# Patient Record
Sex: Female | Born: 1970 | Race: Asian | Hispanic: No | Marital: Married | Smoking: Never smoker
Health system: Southern US, Community
[De-identification: ages and names within clinical notes are randomized; demographics above are authoritative.]

## PROBLEM LIST (undated history)

## (undated) DIAGNOSIS — Z0000 Encounter for general adult medical examination without abnormal findings: Secondary | ICD-10-CM

## (undated) DIAGNOSIS — Z124 Encounter for screening for malignant neoplasm of cervix: Secondary | ICD-10-CM

## (undated) DIAGNOSIS — R634 Abnormal weight loss: Secondary | ICD-10-CM

## (undated) DIAGNOSIS — M545 Low back pain: Secondary | ICD-10-CM

## (undated) DIAGNOSIS — M1990 Unspecified osteoarthritis, unspecified site: Secondary | ICD-10-CM

## (undated) DIAGNOSIS — M542 Cervicalgia: Secondary | ICD-10-CM

## (undated) DIAGNOSIS — T7840XA Allergy, unspecified, initial encounter: Secondary | ICD-10-CM

## (undated) DIAGNOSIS — R350 Frequency of micturition: Secondary | ICD-10-CM

## (undated) DIAGNOSIS — R42 Dizziness and giddiness: Secondary | ICD-10-CM

## (undated) DIAGNOSIS — L309 Dermatitis, unspecified: Secondary | ICD-10-CM

## (undated) DIAGNOSIS — J309 Allergic rhinitis, unspecified: Secondary | ICD-10-CM

## (undated) HISTORY — DX: Unspecified osteoarthritis, unspecified site: M19.90

## (undated) HISTORY — DX: Allergy, unspecified, initial encounter: T78.40XA

## (undated) HISTORY — DX: Allergic rhinitis, unspecified: J30.9

## (undated) HISTORY — DX: Encounter for screening for malignant neoplasm of cervix: Z12.4

## (undated) HISTORY — DX: Encounter for general adult medical examination without abnormal findings: Z00.00

## (undated) HISTORY — PX: BREAST REDUCTION SURGERY: SHX8

## (undated) HISTORY — DX: Abnormal weight loss: R63.4

## (undated) HISTORY — DX: Low back pain: M54.5

## (undated) HISTORY — DX: Frequency of micturition: R35.0

## (undated) HISTORY — DX: Cervicalgia: M54.2

## (undated) HISTORY — PX: REDUCTION MAMMAPLASTY: SUR839

## (undated) HISTORY — DX: Dermatitis, unspecified: L30.9

## (undated) HISTORY — DX: Dizziness and giddiness: R42

---

## 2014-08-10 ENCOUNTER — Other Ambulatory Visit: Payer: Self-pay | Admitting: Family Medicine

## 2014-08-10 ENCOUNTER — Ambulatory Visit (INDEPENDENT_AMBULATORY_CARE_PROVIDER_SITE_OTHER): Payer: 59 | Admitting: Family Medicine

## 2014-08-10 ENCOUNTER — Encounter: Payer: Self-pay | Admitting: Family Medicine

## 2014-08-10 VITALS — BP 97/72 | HR 67 | Temp 99.5°F | Ht <= 58 in | Wt 109.5 lb

## 2014-08-10 DIAGNOSIS — L309 Dermatitis, unspecified: Secondary | ICD-10-CM | POA: Diagnosis not present

## 2014-08-10 DIAGNOSIS — R350 Frequency of micturition: Secondary | ICD-10-CM

## 2014-08-10 DIAGNOSIS — Z0000 Encounter for general adult medical examination without abnormal findings: Secondary | ICD-10-CM | POA: Diagnosis not present

## 2014-08-10 DIAGNOSIS — R5383 Other fatigue: Secondary | ICD-10-CM

## 2014-08-10 DIAGNOSIS — R319 Hematuria, unspecified: Secondary | ICD-10-CM

## 2014-08-10 LAB — LIPID PANEL
Cholesterol: 139 mg/dL (ref 0–200)
HDL: 49.9 mg/dL (ref >39.00)
LDL Cholesterol: 79 mg/dL (ref 0–99)
NonHDL: 89.1
Total CHOL/HDL Ratio: 3
Triglycerides: 50 mg/dL (ref 0.0–149.0)
VLDL: 10 mg/dL (ref 0.0–40.0)

## 2014-08-10 LAB — COMPREHENSIVE METABOLIC PANEL
ALT: 15 U/L (ref 0–35)
AST: 19 U/L (ref 0–37)
Albumin: 4.3 g/dL (ref 3.5–5.2)
Alkaline Phosphatase: 47 U/L (ref 39–117)
BUN: 18 mg/dL (ref 6–23)
CHLORIDE: 108 meq/L (ref 96–112)
CO2: 25 mEq/L (ref 19–32)
Calcium: 9.3 mg/dL (ref 8.4–10.5)
Creatinine, Ser: 0.63 mg/dL (ref 0.40–1.20)
GFR: 108.18 mL/min (ref >60.00)
GLUCOSE: 82 mg/dL (ref 70–99)
Potassium: 4 mEq/L (ref 3.5–5.1)
Sodium: 140 mEq/L (ref 135–145)
Total Bilirubin: 0.4 mg/dL (ref 0.2–1.2)
Total Protein: 7.1 g/dL (ref 6.0–8.3)

## 2014-08-10 LAB — CBC
HEMATOCRIT: 43.3 % (ref 36.0–46.0)
Hemoglobin: 14.4 g/dL (ref 12.0–15.0)
MCHC: 33.2 g/dL (ref 30.0–36.0)
MCV: 92.6 fl (ref 78.0–100.0)
Platelets: 335 10*3/uL (ref 150.0–400.0)
RBC: 4.68 Mil/uL (ref 3.87–5.11)
RDW: 13 % (ref 11.5–15.5)
WBC: 8.3 10*3/uL (ref 4.0–10.5)

## 2014-08-10 LAB — URINALYSIS
Bilirubin Urine: NEGATIVE
Ketones, ur: NEGATIVE
LEUKOCYTES UA: NEGATIVE
NITRITE: NEGATIVE
PH: 6 (ref 5.0–8.0)
SPECIFIC GRAVITY, URINE: 1.015 (ref 1.000–1.030)
TOTAL PROTEIN, URINE-UPE24: NEGATIVE
Urine Glucose: NEGATIVE
Urobilinogen, UA: 0.2 (ref 0.0–1.0)

## 2014-08-10 LAB — TSH: TSH: 1.55 u[IU]/mL (ref 0.35–4.50)

## 2014-08-10 NOTE — Progress Notes (Signed)
Holly Lawrence  [id number] [birth year] 08/10/2014      Progress Note-Follow Up  Subjective  Chief Complaint  Chief Complaint  Patient presents with  . Establish Care    HPI  Patient is a 44 y.o. female in today for routine medical care. Patient is here today P[place] establish care. Is in generally good health. No recent illness. Does complain of itchy skin. She notes it mostly in her face and there is not usually an associated rash. She does work Chief Strategy [employer] and believe she is having an allergic response. Otherwise the patient is doing well. No recent illness.  Past Medical History  Diagnosis Date  . Arthritis   . Allergy     Past Surgical History  Procedure Laterality Date  . Breast reduction surgery      History reviewed. No pertinent family history.  History   Social History  . Marital Status: Married    Spouse Name: N/A  . Number of Children: N/A  . Years of Education: N/A   Occupational History  . Nail Technician    Social History Main Topics  . Smoking status: Never Smoker   . Smokeless tobacco: Not on file  . Alcohol Use: 0.0 oz/week    0 Standard drinks or equivalent per week  . Drug Use: No  . Sexual Activity: Not on file   Other Topics Concern  . Not on file   Social History Narrative  . No narrative on file    No current outpatient prescriptions on file prior [facility] visit.   No current facility-administered medications on file prior [facility] visit.    No Known Allergies  Review of Systems  Review of Systems  Constitutional: Positive for malaise/fatigue. Negative for fever and chills.  HENT: Negative for congestion, hearing loss and nosebleeds.   Eyes: Negative for discharge.  Respiratory: Positive for cough. Negative for sputum production, shortness of breath and wheezing.   Cardiovascular: Negative for chest pain, palpitations and leg swelling.  Gastrointestinal: Negative for heartburn, nausea, vomiting, abdominal pain, diarrhea, constipation and blood  in stool.  Genitourinary: Positive for frequency. Negative for dysuria, urgency and hematuria.  Musculoskeletal: Negative for myalgias, back pain and falls.  Skin: Negative for rash.  Neurological: Negative for dizziness, tremors, sensory change, focal weakness, loss of consciousness, weakness and headaches.  Endo/Heme/Allergies: Negative for polydipsia. Does not bruise/bleed easily.  Psychiatric/Behavioral: Negative for depression and suicidal ideas. The patient is not nervous/anxious and does not have insomnia.     Objective  BP 97/72 mmHg  Pulse 67  Temp(Src) 99.5 F (37.5 C) (Oral)  Ht  (1.448 m)  Wt 109 lb 8 oz (49.669 kg)  BMI 23.69 kg/m2  SpO2 100%  Physical Exam  Physical Exam  Constitutional: She is oriented [place] person, place, and time and well-developed, well-nourished, and in no distress. No distress.  HENT:  Head: Normocephalic and atraumatic.  Right Ear: External ear normal.  Left Ear: External ear normal.  Nose: Nose normal.  Mouth/Throat: Oropharynx is clear and moist. No oropharyngeal exudate.  Eyes: Conjunctivae are normal. Pupils are equal, round, and reactive Bassin light. Right eye exhibits no discharge. Left eye exhibits no discharge. No scleral icterus.  Neck: Normal range of motion. Neck supple. No thyromegaly present.  Cardiovascular: Normal rate, regular rhythm, normal heart sounds and intact distal pulses.   No murmur heard. Pulmonary/Chest: Effort normal and breath sounds normal. No respiratory distress. She has no wheezes. She has no rales.  Abdominal: Soft. Bowel sounds  are normal. She exhibits no distension and no mass. There is no tenderness.  Musculoskeletal: Normal range of motion. She exhibits no edema or tenderness.  Lymphadenopathy:    She has no cervical adenopathy.  Neurological: She is alert and oriented Ground person, place, and time. She has normal reflexes. No cranial nerve deficit. Coordination normal.  Skin: Skin is warm and dry. No rash  noted. She is not diaphoretic.  Psychiatric: Mood, memory and affect normal.    Assessment & Plan  Preventative health care Patient encouraged Klos maintain heart healthy diet, regular exercise, adequate sleep. Consider daily probiotics. Take medications as prescribed  Dermatitis Most notable on face and mostly itchy, worse on days she works at the [employer]. encouraged daily Cetirizine and report persistent symptoms

## 2014-08-10 NOTE — Patient Instructions (Signed)
Preventive Care for Adults A healthy lifestyle and preventive care can promote health and wellness. Preventive health guidelines for women include the following key practices.  A routine yearly physical is a good way Aron check with your health care provider about your health and preventive screening. It is a chance Shular share any concerns and updates on your health and Griffie receive a thorough exam.  Visit your dentist for a routine exam and preventive care every 6 months. Brush your teeth twice a day and floss once a day. Good oral hygiene prevents tooth decay and gum disease.  The frequency of eye exams is based on your age, health, family medical history, use of contact lenses, and other factors. Follow your health care provider's recommendations for frequency of eye exams.  Eat a healthy diet. Foods like vegetables, fruits, whole grains, low-fat dairy products, and lean protein foods contain the nutrients you need without too many calories. Decrease your intake of foods high in solid fats, added sugars, and salt. Eat the right amount of calories for you.Get information about a proper diet from your health care provider, if necessary.  Regular physical exercise is one of the most important things you can do for your health. Most adults should get at least 150 minutes of moderate-intensity exercise (any activity that increases your heart rate and causes you Jarrard sweat) each week. In addition, most adults need muscle-strengthening exercises on 2 or more days a week.  Maintain a healthy weight. The body mass index (BMI) is a screening tool Pethtel identify possible weight problems. It provides an estimate of body fat based on height and weight. Your health care provider can find your BMI and can help you achieve or maintain a healthy weight.For adults 20 years and older:  A BMI below 18.5 is considered underweight.  A BMI of 18.5 Quin 24.9 is normal.  A BMI of 25 Waitman 29.9 is considered overweight.  A BMI of  30 and above is considered obese.  Maintain normal blood lipids and cholesterol levels by exercising and minimizing your intake of saturated fat. Eat a balanced diet with plenty of fruit and vegetables. Blood tests for lipids and cholesterol should begin at age 76 and be repeated every 5 years. If your lipid or cholesterol levels are high, you are over 50, or you are at high risk for heart disease, you may need your cholesterol levels checked more frequently.Ongoing high lipid and cholesterol levels should be treated with medicines if diet and exercise are not working.  If you smoke, find out from your health care provider how Latino quit. If you do not use tobacco, do not start.  Lung cancer screening is recommended for adults aged 22-80 years who are at high risk for developing lung cancer because of a history of smoking. A yearly low-dose CT scan of the lungs is recommended for people who have at least a 30-pack-year history of smoking and are a current smoker or have quit within the past 15 years. A pack year of smoking is smoking an average of 1 pack of cigarettes a day for 1 year (for example: 1 pack a day for 30 years or 2 packs a day for 15 years). Yearly screening should continue until the smoker has stopped smoking for at least 15 years. Yearly screening should be stopped for people who develop a health problem that would prevent them from having lung cancer treatment.  If you are pregnant, do not drink alcohol. If you are breastfeeding,  be very cautious about drinking alcohol. If you are not pregnant and choose Cenci drink alcohol, do not have more than 1 drink per day. One drink is considered Erhart be 12 ounces (355 mL) of beer, 5 ounces (148 mL) of wine, or 1.5 ounces (44 mL) of liquor.  Avoid use of street drugs. Do not share needles with anyone. Ask for help if you need support or instructions about stopping the use of drugs.  High blood pressure causes heart disease and increases the risk of  stroke. Your blood pressure should be checked at least every 1 Lahman 2 years. Ongoing high blood pressure should be treated with medicines if weight loss and exercise do not work.  If you are 75-52 years old, ask your health care provider if you should take aspirin Wessler prevent strokes.  Diabetes screening involves taking a blood sample Birkey check your fasting blood sugar level. This should be done once every 3 years, after age 15, if you are within normal weight and without risk factors for diabetes. Testing should be considered at a younger age or be carried out more frequently if you are overweight and have at least 1 risk factor for diabetes.  Breast cancer screening is essential preventive care for women. You should practice "breast self-awareness." This means understanding the normal appearance and feel of your breasts and may include breast self-examination. Any changes detected, no matter how small, should be reported Ratterman a health care provider. Women in their 58s and 30s should have a clinical breast exam (CBE) by a health care provider as part of a regular health exam every 1 Bibby 3 years. After age 16, women should have a CBE every year. Starting at age 53, women should consider having a mammogram (breast X-ray test) every year. Women who have a family history of breast cancer should talk Baty their health care provider about genetic screening. Women at a high risk of breast cancer should talk Detloff their health care providers about having an MRI and a mammogram every year.  Breast cancer gene (BRCA)-related cancer risk assessment is recommended for women who have family members with BRCA-related cancers. BRCA-related cancers include breast, ovarian, tubal, and peritoneal cancers. Having family members with these cancers may be associated with an increased risk for harmful changes (mutations) in the breast cancer genes BRCA1 and BRCA2. Results of the assessment will determine the need for genetic counseling and  BRCA1 and BRCA2 testing.  Routine pelvic exams Copher screen for cancer are no longer recommended for nonpregnant women who are considered low risk for cancer of the pelvic organs (ovaries, uterus, and vagina) and who do not have symptoms. Ask your health care provider if a screening pelvic exam is right for you.  If you have had past treatment for cervical cancer or a condition that could lead Lenhardt cancer, you need Pap tests and screening for cancer for at least 20 years after your treatment. If Pap tests have been discontinued, your risk factors (such as having a new sexual partner) need Mandarino be reassessed Couey determine if screening should be resumed. Some women have medical problems that increase the chance of getting cervical cancer. In these cases, your health care provider may recommend more frequent screening and Pap tests.  The HPV test is an additional test that may be used for cervical cancer screening. The HPV test looks for the virus that can cause the cell changes on the cervix. The cells collected during the Pap test can be  tested for HPV. The HPV test could be used Pinegar screen women aged 30 years and older, and should be used in women of any age who have unclear Pap test results. After the age of 30, women should have HPV testing at the same frequency as a Pap test.  Colorectal cancer can be detected and often prevented. Most routine colorectal cancer screening begins at the age of 50 years and continues through age 75 years. However, your health care provider may recommend screening at an earlier age if you have risk factors for colon cancer. On a yearly basis, your health care provider may provide home test kits Marseille check for hidden blood in the stool. Use of a small camera at the end of a tube, Straughter directly examine the colon (sigmoidoscopy or colonoscopy), can detect the earliest forms of colorectal cancer. Talk Mascaro your health care provider about this at age 50, when routine screening begins. Direct  exam of the colon should be repeated every 5-10 years through age 75 years, unless early forms of pre-cancerous polyps or small growths are found.  People who are at an increased risk for hepatitis B should be screened for this virus. You are considered at high risk for hepatitis B if:  You were born in a country where hepatitis B occurs often. Talk with your health care provider about which countries are considered high risk.  Your parents were born in a high-risk country and you have not received a shot Hur protect against hepatitis B (hepatitis B vaccine).  You have HIV or AIDS.  You use needles Scicchitano inject street drugs.  You live with, or have sex with, someone who has hepatitis B.  You get hemodialysis treatment.  You take certain medicines for conditions like cancer, organ transplantation, and autoimmune conditions.  Hepatitis C blood testing is recommended for all people born from 1945 through 1965 and any individual with known risks for hepatitis C.  Practice safe sex. Use condoms and avoid high-risk sexual practices Pricer reduce the spread of sexually transmitted infections (STIs). STIs include gonorrhea, chlamydia, syphilis, trichomonas, herpes, HPV, and human immunodeficiency virus (HIV). Herpes, HIV, and HPV are viral illnesses that have no cure. They can result in disability, cancer, and death.  You should be screened for sexually transmitted illnesses (STIs) including gonorrhea and chlamydia if:  You are sexually active and are younger than 24 years.  You are older than 24 years and your health care provider tells you that you are at risk for this type of infection.  Your sexual activity has changed since you were last screened and you are at an increased risk for chlamydia or gonorrhea. Ask your health care provider if you are at risk.  If you are at risk of being infected with HIV, it is recommended that you take a prescription medicine daily Pankow prevent HIV infection. This is  called preexposure prophylaxis (PrEP). You are considered at risk if:  You are a heterosexual woman, are sexually active, and are at increased risk for HIV infection.  You take drugs by injection.  You are sexually active with a partner who has HIV.  Talk with your health care provider about whether you are at high risk of being infected with HIV. If you choose Rymer begin PrEP, you should first be tested for HIV. You should then be tested every 3 months for as long as you are taking PrEP.  Osteoporosis is a disease in which the bones lose minerals and strength   with aging. This can result in serious bone fractures or breaks. The risk of osteoporosis can be identified using a bone density scan. Women ages 65 years and over and women at risk for fractures or osteoporosis should discuss screening with their health care providers. Ask your health care provider whether you should take a calcium supplement or vitamin D Weilbacher reduce the rate of osteoporosis.  Menopause can be associated with physical symptoms and risks. Hormone replacement therapy is available Piatkowski decrease symptoms and risks. You should talk Frenz your health care provider about whether hormone replacement therapy is right for you.  Use sunscreen. Apply sunscreen liberally and repeatedly throughout the day. You should seek shade when your shadow is shorter than you. Protect yourself by wearing long sleeves, pants, a wide-brimmed hat, and sunglasses year round, whenever you are outdoors.  Once a month, do a whole body skin exam, using a mirror Schoeneck look at the skin on your back. Tell your health care provider of new moles, moles that have irregular borders, moles that are larger than a pencil eraser, or moles that have changed in shape or color.  Stay current with required vaccines (immunizations).  Influenza vaccine. All adults should be immunized every year.  Tetanus, diphtheria, and acellular pertussis (Td, Tdap) vaccine. Pregnant women should  receive 1 dose of Tdap vaccine during each pregnancy. The dose should be obtained regardless of the length of time since the last dose. Immunization is preferred during the 27th-36th week of gestation. An adult who has not previously received Tdap or who does not know her vaccine status should receive 1 dose of Tdap. This initial dose should be followed by tetanus and diphtheria toxoids (Td) booster doses every 10 years. Adults with an unknown or incomplete history of completing a 3-dose immunization series with Td-containing vaccines should begin or complete a primary immunization series including a Tdap dose. Adults should receive a Td booster every 10 years.  Varicella vaccine. An adult without evidence of immunity Nyquist varicella should receive 2 doses or a second dose if she has previously received 1 dose. Pregnant females who do not have evidence of immunity should receive the first dose after pregnancy. This first dose should be obtained before leaving the health care facility. The second dose should be obtained 4-8 weeks after the first dose.  Human papillomavirus (HPV) vaccine. Females aged 13-26 years who have not received the vaccine previously should obtain the 3-dose series. The vaccine is not recommended for use in pregnant females. However, pregnancy testing is not needed before receiving a dose. If a female is found Eddins be pregnant after receiving a dose, no treatment is needed. In that case, the remaining doses should be delayed until after the pregnancy. Immunization is recommended for any person with an immunocompromised condition through the age of 26 years if she did not get any or all doses earlier. During the 3-dose series, the second dose should be obtained 4-8 weeks after the first dose. The third dose should be obtained 24 weeks after the first dose and 16 weeks after the second dose.  Zoster vaccine. One dose is recommended for adults aged 60 years or older unless certain conditions are  present.  Measles, mumps, and rubella (MMR) vaccine. Adults born before 1957 generally are considered immune Corey measles and mumps. Adults born in 1957 or later should have 1 or more doses of MMR vaccine unless there is a contraindication Kittel the vaccine or there is laboratory evidence of immunity Cusic   each of the three diseases. A routine second dose of MMR vaccine should be obtained at least 28 days after the first dose for students attending postsecondary schools, health care workers, or international travelers. People who received inactivated measles vaccine or an unknown type of measles vaccine during 1963-1967 should receive 2 doses of MMR vaccine. People who received inactivated mumps vaccine or an unknown type of mumps vaccine before 1979 and are at high risk for mumps infection should consider immunization with 2 doses of MMR vaccine. For females of childbearing age, rubella immunity should be determined. If there is no evidence of immunity, females who are not pregnant should be vaccinated. If there is no evidence of immunity, females who are pregnant should delay immunization until after pregnancy. Unvaccinated health care workers born before 1957 who lack laboratory evidence of measles, mumps, or rubella immunity or laboratory confirmation of disease should consider measles and mumps immunization with 2 doses of MMR vaccine or rubella immunization with 1 dose of MMR vaccine.  Pneumococcal 13-valent conjugate (PCV13) vaccine. When indicated, a person who is uncertain of her immunization history and has no record of immunization should receive the PCV13 vaccine. An adult aged 19 years or older who has certain medical conditions and has not been previously immunized should receive 1 dose of PCV13 vaccine. This PCV13 should be followed with a dose of pneumococcal polysaccharide (PPSV23) vaccine. The PPSV23 vaccine dose should be obtained at least 8 weeks after the dose of PCV13 vaccine. An adult aged 19  years or older who has certain medical conditions and previously received 1 or more doses of PPSV23 vaccine should receive 1 dose of PCV13. The PCV13 vaccine dose should be obtained 1 or more years after the last PPSV23 vaccine dose.  Pneumococcal polysaccharide (PPSV23) vaccine. When PCV13 is also indicated, PCV13 should be obtained first. All adults aged 65 years and older should be immunized. An adult younger than age 65 years who has certain medical conditions should be immunized. Any person who resides in a nursing home or long-term care facility should be immunized. An adult smoker should be immunized. People with an immunocompromised condition and certain other conditions should receive both PCV13 and PPSV23 vaccines. People with human immunodeficiency virus (HIV) infection should be immunized as soon as possible after diagnosis. Immunization during chemotherapy or radiation therapy should be avoided. Routine use of PPSV23 vaccine is not recommended for American Indians, [place] Natives, or people younger than 65 years unless there are medical conditions that require PPSV23 vaccine. When indicated, people who have unknown immunization and have no record of immunization should receive PPSV23 vaccine. One-time revaccination 5 years after the first dose of PPSV23 is recommended for people aged 19-64 years who have chronic kidney failure, nephrotic syndrome, asplenia, or immunocompromised conditions. People who received 1-2 doses of PPSV23 before age 65 years should receive another dose of PPSV23 vaccine at age 65 years or later if at least 5 years have passed since the previous dose. Doses of PPSV23 are not needed for people immunized with PPSV23 at or after age 65 years.  Meningococcal vaccine. Adults with asplenia or persistent complement component deficiencies should receive 2 doses of quadrivalent meningococcal conjugate (MenACWY-D) vaccine. The doses should be obtained at least 2 months apart.  Microbiologists working with certain meningococcal bacteria, military recruits, people at risk during an outbreak, and people who travel Whittington or live in countries with a high rate of meningitis should be immunized. A first-year college student up through age   21 years who is living in a residence hall should receive a dose if she did not receive a dose on or after her 16th birthday. Adults who have certain high-risk conditions should receive one or more doses of vaccine.  Hepatitis A vaccine. Adults who wish Pelfrey be protected from this disease, have certain high-risk conditions, work with hepatitis A-infected animals, work in hepatitis A research labs, or travel Joiner or work in countries with a high rate of hepatitis A should be immunized. Adults who were previously unvaccinated and who anticipate close contact with an international adoptee during the first 60 days after arrival in the [place] from a country with a high rate of hepatitis A should be immunized.  Hepatitis B vaccine. Adults who wish Bertelson be protected from this disease, have certain high-risk conditions, may be exposed Glazier blood or other infectious body fluids, are household contacts or sex partners of hepatitis B positive people, are clients or workers in certain care facilities, or travel Brining or work in countries with a high rate of hepatitis B should be immunized.  Haemophilus influenzae type b (Hib) vaccine. A previously unvaccinated person with asplenia or sickle cell disease or having a scheduled splenectomy should receive 1 dose of Hib vaccine. Regardless of previous immunization, a recipient of a hematopoietic stem cell transplant should receive a 3-dose series 6-12 months after her successful transplant. Hib vaccine is not recommended for adults with HIV infection. Preventive Services / Frequency Ages 64 Hustead 68 years  Blood pressure check.** / Every 1 Doverspike 2 years.  Lipid and cholesterol check.** / Every 5 years beginning at age  22.  Clinical breast exam.** / Every 3 years for women in their 88s and 53s.  BRCA-related cancer risk assessment.** / For women who have family members with a BRCA-related cancer (breast, ovarian, tubal, or peritoneal cancers).  Pap test.** / Every 2 years from ages 90 through 51. Every 3 years starting at age 21 through age 56 or 3 with a history of 3 consecutive normal Pap tests.  HPV screening.** / Every 3 years from ages 24 through ages 1 Mannis 46 with a history of 3 consecutive normal Pap tests.  Hepatitis C blood test.** / For any individual with known risks for hepatitis C.  Skin self-exam. / Monthly.  Influenza vaccine. / Every year.  Tetanus, diphtheria, and acellular pertussis (Tdap, Td) vaccine.** / Consult your health care provider. Pregnant women should receive 1 dose of Tdap vaccine during each pregnancy. 1 dose of Td every 10 years.  Varicella vaccine.** / Consult your health care provider. Pregnant females who do not have evidence of immunity should receive the first dose after pregnancy.  HPV vaccine. / 3 doses over 6 months, if 72 and younger. The vaccine is not recommended for use in pregnant females. However, pregnancy testing is not needed before receiving a dose.  Measles, mumps, rubella (MMR) vaccine.** / You need at least 1 dose of MMR if you were born in 1957 or later. You may also need a 2nd dose. For females of childbearing age, rubella immunity should be determined. If there is no evidence of immunity, females who are not pregnant should be vaccinated. If there is no evidence of immunity, females who are pregnant should delay immunization until after pregnancy.  Pneumococcal 13-valent conjugate (PCV13) vaccine.** / Consult your health care provider.  Pneumococcal polysaccharide (PPSV23) vaccine.** / 1 Regula 2 doses if you smoke cigarettes or if you have certain conditions.  Meningococcal vaccine.** /  1 dose if you are age 19 Strole 21 years and a first-year college  student living in a residence hall, or have one of several medical conditions, you need Mally get vaccinated against meningococcal disease. You may also need additional booster doses.  Hepatitis A vaccine.** / Consult your health care provider.  Hepatitis B vaccine.** / Consult your health care provider.  Haemophilus influenzae type b (Hib) vaccine.** / Consult your health care provider. Ages 40 Bunner 64 years  Blood pressure check.** / Every 1 Hemenway 2 years.  Lipid and cholesterol check.** / Every 5 years beginning at age 20 years.  Lung cancer screening. / Every year if you are aged 55-80 years and have a 30-pack-year history of smoking and currently smoke or have quit within the past 15 years. Yearly screening is stopped once you have quit smoking for at least 15 years or develop a health problem that would prevent you from having lung cancer treatment.  Clinical breast exam.** / Every year after age 40 years.  BRCA-related cancer risk assessment.** / For women who have family members with a BRCA-related cancer (breast, ovarian, tubal, or peritoneal cancers).  Mammogram.** / Every year beginning at age 40 years and continuing for as long as you are in good health. Consult with your health care provider.  Pap test.** / Every 3 years starting at age 30 years through age 65 or 70 years with a history of 3 consecutive normal Pap tests.  HPV screening.** / Every 3 years from ages 30 years through ages 65 Witters 70 years with a history of 3 consecutive normal Pap tests.  Fecal occult blood test (FOBT) of stool. / Every year beginning at age 50 years and continuing until age 75 years. You may not need Simkin do this test if you get a colonoscopy every 10 years.  Flexible sigmoidoscopy or colonoscopy.** / Every 5 years for a flexible sigmoidoscopy or every 10 years for a colonoscopy beginning at age 50 years and continuing until age 75 years.  Hepatitis C blood test.** / For all people born from 1945 through  1965 and any individual with known risks for hepatitis C.  Skin self-exam. / Monthly.  Influenza vaccine. / Every year.  Tetanus, diphtheria, and acellular pertussis (Tdap/Td) vaccine.** / Consult your health care provider. Pregnant women should receive 1 dose of Tdap vaccine during each pregnancy. 1 dose of Td every 10 years.  Varicella vaccine.** / Consult your health care provider. Pregnant females who do not have evidence of immunity should receive the first dose after pregnancy.  Zoster vaccine.** / 1 dose for adults aged 60 years or older.  Measles, mumps, rubella (MMR) vaccine.** / You need at least 1 dose of MMR if you were born in 1957 or later. You may also need a 2nd dose. For females of childbearing age, rubella immunity should be determined. If there is no evidence of immunity, females who are not pregnant should be vaccinated. If there is no evidence of immunity, females who are pregnant should delay immunization until after pregnancy.  Pneumococcal 13-valent conjugate (PCV13) vaccine.** / Consult your health care provider.  Pneumococcal polysaccharide (PPSV23) vaccine.** / 1 Mackley 2 doses if you smoke cigarettes or if you have certain conditions.  Meningococcal vaccine.** / Consult your health care provider.  Hepatitis A vaccine.** / Consult your health care provider.  Hepatitis B vaccine.** / Consult your health care provider.  Haemophilus influenzae type b (Hib) vaccine.** / Consult your health care provider. Ages 65   years and over  Blood pressure check.** / Every 1 Skibicki 2 years.  Lipid and cholesterol check.** / Every 5 years beginning at age 22 years.  Lung cancer screening. / Every year if you are aged 73-80 years and have a 30-pack-year history of smoking and currently smoke or have quit within the past 15 years. Yearly screening is stopped once you have quit smoking for at least 15 years or develop a health problem that would prevent you from having lung cancer  treatment.  Clinical breast exam.** / Every year after age 4 years.  BRCA-related cancer risk assessment.** / For women who have family members with a BRCA-related cancer (breast, ovarian, tubal, or peritoneal cancers).  Mammogram.** / Every year beginning at age 40 years and continuing for as long as you are in good health. Consult with your health care provider.  Pap test.** / Every 3 years starting at age 9 years through age 34 or 91 years with 3 consecutive normal Pap tests. Testing can be stopped between 65 and 70 years with 3 consecutive normal Pap tests and no abnormal Pap or HPV tests in the past 10 years.  HPV screening.** / Every 3 years from ages 57 years through ages 64 or 45 years with a history of 3 consecutive normal Pap tests. Testing can be stopped between 65 and 70 years with 3 consecutive normal Pap tests and no abnormal Pap or HPV tests in the past 10 years.  Fecal occult blood test (FOBT) of stool. / Every year beginning at age 15 years and continuing until age 17 years. You may not need Rueger do this test if you get a colonoscopy every 10 years.  Flexible sigmoidoscopy or colonoscopy.** / Every 5 years for a flexible sigmoidoscopy or every 10 years for a colonoscopy beginning at age 86 years and continuing until age 71 years.  Hepatitis C blood test.** / For all people born from [birth year] through 1967 and any individual with known risks for hepatitis C.  Osteoporosis screening.** / A one-time screening for women ages 83 years and over and women at risk for fractures or osteoporosis.  Skin self-exam. / Monthly.  Influenza vaccine. / Every year.  Tetanus, diphtheria, and acellular pertussis (Tdap/Td) vaccine.** / 1 dose of Td every 10 years.  Varicella vaccine.** / Consult your health care provider.  Zoster vaccine.** / 1 dose for adults aged 61 years or older.  Pneumococcal 13-valent conjugate (PCV13) vaccine.** / Consult your health care provider.  Pneumococcal  polysaccharide (PPSV23) vaccine.** / 1 dose for all adults aged 28 years and older.  Meningococcal vaccine.** / Consult your health care provider.  Hepatitis A vaccine.** / Consult your health care provider.  Hepatitis B vaccine.** / Consult your health care provider.  Haemophilus influenzae type b (Hib) vaccine.** / Consult your health care provider. ** Family history and personal history of risk and conditions may change your health care provider's recommendations. Document Released: 04/24/2001 Document Revised: 07/13/2013 Document Reviewed: 07/24/2010 [facility] Patient Information 2015 [place], [place]. This information is not intended Vayda replace advice given Mazon you by your health care provider. Make sure you discuss any questions you have with your health care provider.

## 2014-08-10 NOTE — Progress Notes (Signed)
Pre visit review using our clinic review tool, if applicable. No additional management support is needed unless otherwise documented below in the visit note. 

## 2014-08-12 ENCOUNTER — Other Ambulatory Visit: Payer: Self-pay | Admitting: Family Medicine

## 2014-08-12 LAB — URINE CULTURE

## 2014-08-12 MED ORDER — AMOXICILLIN 500 MG PO CAPS: 500.0000 mg | ORAL_CAPSULE | Freq: Three times a day (TID) | ORAL | Status: DC

## 2014-08-12 NOTE — Telephone Encounter (Signed)
Sent in antibiotic for UTI per lab results instructions.l

## 2014-08-22 ENCOUNTER — Encounter: Payer: Self-pay | Admitting: Family Medicine

## 2014-08-22 DIAGNOSIS — Z0000 Encounter for general adult medical examination without abnormal findings: Secondary | ICD-10-CM | POA: Insufficient documentation

## 2014-08-22 DIAGNOSIS — L309 Dermatitis, unspecified: Secondary | ICD-10-CM

## 2014-08-22 HISTORY — DX: Dermatitis, unspecified: L30.9

## 2014-08-22 HISTORY — DX: Encounter for general adult medical examination without abnormal findings: Z00.00

## 2014-08-22 NOTE — Assessment & Plan Note (Signed)
Patient encouraged Wares maintain heart healthy diet, regular exercise, adequate sleep. Consider daily probiotics. Take medications as prescribed 

## 2014-08-22 NOTE — Assessment & Plan Note (Signed)
Most notable on face and mostly itchy, worse on days she works at the [employer]. encouraged daily Cetirizine and report persistent symptoms

## 2014-08-23 ENCOUNTER — Telehealth: Payer: Self-pay | Admitting: Family Medicine

## 2014-08-23 ENCOUNTER — Other Ambulatory Visit: Payer: Self-pay | Admitting: Family Medicine

## 2014-08-23 DIAGNOSIS — Z1239 Encounter for other screening for malignant neoplasm of breast: Secondary | ICD-10-CM

## 2014-08-23 NOTE — Telephone Encounter (Signed)
Caller name: Reyne Dumas Relationship Rahimi patient: daughter Can be reached: [phone] Pharmacy:  Reason for call: Pt is wanting Seeney get a mammogram. Can that be ordered by Dr. Abner Greenspan?

## 2014-08-31 ENCOUNTER — Ambulatory Visit (HOSPITAL_BASED_OUTPATIENT_CLINIC_OR_DEPARTMENT_OTHER)
Admission: RE | Admit: 2014-08-31 | Discharge: 2014-08-31 | Disposition: A | Discharge status: Home / Self Care | Payer: 59 | Source: Ambulatory Visit | Attending: Family Medicine | Admitting: Family Medicine

## 2014-08-31 DIAGNOSIS — Z1231 Encounter for screening mammogram for malignant neoplasm of breast: Secondary | ICD-10-CM | POA: Diagnosis not present

## 2014-08-31 DIAGNOSIS — Z1239 Encounter for other screening for malignant neoplasm of breast: Secondary | ICD-10-CM

## 2014-09-07 ENCOUNTER — Telehealth: Payer: Self-pay | Admitting: Family Medicine

## 2014-09-07 NOTE — Telephone Encounter (Signed)
Caller name: Reyne DumasDoan,Uyen Relation Klose pt:  daughter  Call back number: [phone]   Reason for call:  Daughter inquiring about pt mamo results. Pt is eager Louque know results advised daughter MD is out of office until 09/09/14.

## 2014-09-07 NOTE — Telephone Encounter (Signed)
Mammogram reviewed in Dr. Mariel AloeBlyth's absence. No evidence of malignancy. Looked good overall.  Will repeat in 1 year.

## 2014-09-07 NOTE — Telephone Encounter (Signed)
Informed the patient (daughter) of results.

## 2014-10-05 ENCOUNTER — Other Ambulatory Visit (INDEPENDENT_AMBULATORY_CARE_PROVIDER_SITE_OTHER): Payer: 59

## 2014-10-05 DIAGNOSIS — R319 Hematuria, unspecified: Secondary | ICD-10-CM

## 2014-10-05 LAB — URINALYSIS, ROUTINE W REFLEX MICROSCOPIC
Bilirubin Urine: NEGATIVE
Ketones, ur: NEGATIVE
LEUKOCYTES UA: NEGATIVE
Nitrite: NEGATIVE
RBC / HPF: NONE SEEN (ref 0–?)
Specific Gravity, Urine: 1.015 (ref 1.000–1.030)
Total Protein, Urine: NEGATIVE
URINE GLUCOSE: NEGATIVE
Urobilinogen, UA: 0.2 (ref 0.0–1.0)
pH: 5.5 (ref 5.0–8.0)

## 2014-10-06 ENCOUNTER — Telehealth: Payer: Self-pay

## 2014-10-06 DIAGNOSIS — R319 Hematuria, unspecified: Secondary | ICD-10-CM

## 2014-10-06 NOTE — Telephone Encounter (Signed)
Talked daughter states pt did have spotting during last test but test will be repeated in one month. No questions at this time.

## 2014-10-06 NOTE — Telephone Encounter (Signed)
-----   Message from Bradd Canary, MD sent at 10/05/2014 10:16 PM EDT ----- Notify urinalysis shows no sign of infection. Only a trace of blood, repeat a UA in 1 month Hadlock se if that resolves

## 2015-12-13 ENCOUNTER — Ambulatory Visit (INDEPENDENT_AMBULATORY_CARE_PROVIDER_SITE_OTHER): Payer: BLUE CROSS/BLUE SHIELD | Admitting: Family Medicine

## 2015-12-13 ENCOUNTER — Encounter: Payer: Self-pay | Admitting: Family Medicine

## 2015-12-13 ENCOUNTER — Other Ambulatory Visit (HOSPITAL_COMMUNITY)
Admission: RE | Admit: 2015-12-13 | Discharge: 2015-12-13 | Disposition: A | Discharge status: Home / Self Care | Payer: BLUE CROSS/BLUE SHIELD | Source: Ambulatory Visit | Attending: Family Medicine | Admitting: Family Medicine

## 2015-12-13 VITALS — BP 82/60 | HR 66 | Temp 98.2°F | Wt 107.4 lb

## 2015-12-13 DIAGNOSIS — Z1231 Encounter for screening mammogram for malignant neoplasm of breast: Secondary | ICD-10-CM | POA: Diagnosis not present

## 2015-12-13 DIAGNOSIS — Z0000 Encounter for general adult medical examination without abnormal findings: Secondary | ICD-10-CM | POA: Diagnosis not present

## 2015-12-13 DIAGNOSIS — Z1239 Encounter for other screening for malignant neoplasm of breast: Secondary | ICD-10-CM

## 2015-12-13 DIAGNOSIS — Z124 Encounter for screening for malignant neoplasm of cervix: Secondary | ICD-10-CM

## 2015-12-13 DIAGNOSIS — Z23 Encounter for immunization: Secondary | ICD-10-CM

## 2015-12-13 DIAGNOSIS — Z01419 Encounter for gynecological examination (general) (routine) without abnormal findings: Secondary | ICD-10-CM | POA: Insufficient documentation

## 2015-12-13 DIAGNOSIS — L309 Dermatitis, unspecified: Secondary | ICD-10-CM

## 2015-12-13 DIAGNOSIS — M542 Cervicalgia: Secondary | ICD-10-CM | POA: Insufficient documentation

## 2015-12-13 HISTORY — DX: Encounter for screening for malignant neoplasm of cervix: Z12.4

## 2015-12-13 HISTORY — DX: Cervicalgia: M54.2

## 2015-12-13 MED ORDER — TIZANIDINE HCL 4 MG PO TABS: 4.0000 mg | ORAL_TABLET | Freq: Three times a day (TID) | ORAL | 0 refills | Status: DC | PRN
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
## 2015-12-13 NOTE — Patient Instructions (Signed)
Benadryl for allergic reaction 25 Brooking 50 mg as needed will help rash and allergies.   Preventive Care for Adults, Female A healthy lifestyle and preventive care can promote health and wellness. Preventive health guidelines for women include the following key practices.  A routine yearly physical is a good way Vos check with your health care provider about your health and preventive screening. It is a chance Feldstein share any concerns and updates on your health and Reede receive a thorough exam.  Visit your dentist for a routine exam and preventive care every 6 months. Brush your teeth twice a day and floss once a day. Good oral hygiene prevents tooth decay and gum disease.  The frequency of eye exams is based on your age, health, family medical history, use of contact lenses, and other factors. Follow your health care provider's recommendations for frequency of eye exams.  Eat a healthy diet. Foods like vegetables, fruits, whole grains, low-fat dairy products, and lean protein foods contain the nutrients you need without too many calories. Decrease your intake of foods high in solid fats, added sugars, and salt. Eat the right amount of calories for you.Get information about a proper diet from your health care provider, if necessary.  Regular physical exercise is one of the most important things you can do for your health. Most adults should get at least 150 minutes of moderate-intensity exercise (any activity that increases your heart rate and causes you Fick sweat) each week. In addition, most adults need muscle-strengthening exercises on 2 or more days a week.  Maintain a healthy weight. The body mass index (BMI) is a screening tool Buescher identify possible weight problems. It provides an estimate of body fat based on height and weight. Your health care provider can find your BMI and can help you achieve or maintain a healthy weight.For adults 20 years and older:  A BMI below 18.5 is considered  underweight.  A BMI of 18.5 Devaul 24.9 is normal.  A BMI of 25 Bickert 29.9 is considered overweight.  A BMI of 30 and above is considered obese.  Maintain normal blood lipids and cholesterol levels by exercising and minimizing your intake of saturated fat. Eat a balanced diet with plenty of fruit and vegetables. Blood tests for lipids and cholesterol should begin at age 30 and be repeated every 5 years. If your lipid or cholesterol levels are high, you are over 50, or you are at high risk for heart disease, you may need your cholesterol levels checked more frequently.Ongoing high lipid and cholesterol levels should be treated with medicines if diet and exercise are not working.  If you smoke, find out from your health care provider how Tramble quit. If you do not use tobacco, do not start.  Lung cancer screening is recommended for adults aged 18-80 years who are at high risk for developing lung cancer because of a history of smoking. A yearly low-dose CT scan of the lungs is recommended for people who have at least a 30-pack-year history of smoking and are a current smoker or have quit within the past 15 years. A pack year of smoking is smoking an average of 1 pack of cigarettes a day for 1 year (for example: 1 pack a day for 30 years or 2 packs a day for 15 years). Yearly screening should continue until the smoker has stopped smoking for at least 15 years. Yearly screening should be stopped for people who develop a health problem that would prevent them  from having lung cancer treatment.  If you are pregnant, do not drink alcohol. If you are breastfeeding, be very cautious about drinking alcohol. If you are not pregnant and choose Thobe drink alcohol, do not have more than 1 drink per day. One drink is considered Dorris be 12 ounces (355 mL) of beer, 5 ounces (148 mL) of wine, or 1.5 ounces (44 mL) of liquor.  Avoid use of street drugs. Do not share needles with anyone. Ask for help if you need support or  instructions about stopping the use of drugs.  High blood pressure causes heart disease and increases the risk of stroke. Your blood pressure should be checked at least every 1 Atkison 2 years. Ongoing high blood pressure should be treated with medicines if weight loss and exercise do not work.  If you are 55-79 years old, ask your health care provider if you should take aspirin Ewart prevent strokes.  Diabetes screening is done by taking a blood sample Pereida check your blood glucose level after you have not eaten for a certain period of time (fasting). If you are not overweight and you do not have risk factors for diabetes, you should be screened once every 3 years starting at age 45. If you are overweight or obese and you are 40-70 years of age, you should be screened for diabetes every year as part of your cardiovascular risk assessment.  Breast cancer screening is essential preventive care for women. You should practice "breast self-awareness." This means understanding the normal appearance and feel of your breasts and may include breast self-examination. Any changes detected, no matter how small, should be reported Woodrome a health care provider. Women in their 20s and 30s should have a clinical breast exam (CBE) by a health care provider as part of a regular health exam every 1 Galan 3 years. After age 40, women should have a CBE every year. Starting at age 40, women should consider having a mammogram (breast X-ray test) every year. Women who have a family history of breast cancer should talk Runion their health care provider about genetic screening. Women at a high risk of breast cancer should talk Lantier their health care providers about having an MRI and a mammogram every year.  Breast cancer gene (BRCA)-related cancer risk assessment is recommended for women who have family members with BRCA-related cancers. BRCA-related cancers include breast, ovarian, tubal, and peritoneal cancers. Having family members with these  cancers may be associated with an increased risk for harmful changes (mutations) in the breast cancer genes BRCA1 and BRCA2. Results of the assessment will determine the need for genetic counseling and BRCA1 and BRCA2 testing.  Your health care provider may recommend that you be screened regularly for cancer of the pelvic organs (ovaries, uterus, and vagina). This screening involves a pelvic examination, including checking for microscopic changes Riddles the surface of your cervix (Pap test). You may be encouraged Brereton have this screening done every 3 years, beginning at age 21.  For women ages 30-65, health care providers may recommend pelvic exams and Pap testing every 3 years, or they may recommend the Pap and pelvic exam, combined with testing for human papilloma virus (HPV), every 5 years. Some types of HPV increase your risk of cervical cancer. Testing for HPV may also be done on women of any age with unclear Pap test results.  Other health care providers may not recommend any screening for nonpregnant women who are considered low risk for pelvic cancer and who   do not have symptoms. Ask your health care provider if a screening pelvic exam is right for you.  If you have had past treatment for cervical cancer or a condition that could lead Yonan cancer, you need Pap tests and screening for cancer for at least 20 years after your treatment. If Pap tests have been discontinued, your risk factors (such as having a new sexual partner) need Rohwer be reassessed Markham determine if screening should resume. Some women have medical problems that increase the chance of getting cervical cancer. In these cases, your health care provider may recommend more frequent screening and Pap tests.  Colorectal cancer can be detected and often prevented. Most routine colorectal cancer screening begins at the age of 50 years and continues through age 75 years. However, your health care provider may recommend screening at an earlier age if you  have risk factors for colon cancer. On a yearly basis, your health care provider may provide home test kits Breese check for hidden blood in the stool. Use of a small camera at the end of a tube, Nowack directly examine the colon (sigmoidoscopy or colonoscopy), can detect the earliest forms of colorectal cancer. Talk Sikorski your health care provider about this at age 50, when routine screening begins. Direct exam of the colon should be repeated every 5-10 years through age 75 years, unless early forms of precancerous polyps or small growths are found.  People who are at an increased risk for hepatitis B should be screened for this virus. You are considered at high risk for hepatitis B if:  You were born in a country where hepatitis B occurs often. Talk with your health care provider about which countries are considered high risk.  Your parents were born in a high-risk country and you have not received a shot Bertini protect against hepatitis B (hepatitis B vaccine).  You have HIV or AIDS.  You use needles Gsell inject street drugs.  You live with, or have sex with, someone who has hepatitis B.  You get hemodialysis treatment.  You take certain medicines for conditions like cancer, organ transplantation, and autoimmune conditions.  Hepatitis C blood testing is recommended for all people born from 1945 through 1965 and any individual with known risks for hepatitis C.  Practice safe sex. Use condoms and avoid high-risk sexual practices Mcloughlin reduce the spread of sexually transmitted infections (STIs). STIs include gonorrhea, chlamydia, syphilis, trichomonas, herpes, HPV, and human immunodeficiency virus (HIV). Herpes, HIV, and HPV are viral illnesses that have no cure. They can result in disability, cancer, and death.  You should be screened for sexually transmitted illnesses (STIs) including gonorrhea and chlamydia if:  You are sexually active and are younger than 24 years.  You are older than 24 years and your  health care provider tells you that you are at risk for this type of infection.  Your sexual activity has changed since you were last screened and you are at an increased risk for chlamydia or gonorrhea. Ask your health care provider if you are at risk.  If you are at risk of being infected with HIV, it is recommended that you take a prescription medicine daily Hegstrom prevent HIV infection. This is called preexposure prophylaxis (PrEP). You are considered at risk if:  You are sexually active and do not regularly use condoms or know the HIV status of your partner(s).  You take drugs by injection.  You are sexually active with a partner who has HIV.  Talk with   your health care provider about whether you are at high risk of being infected with HIV. If you choose Ellwood begin PrEP, you should first be tested for HIV. You should then be tested every 3 months for as long as you are taking PrEP.  Osteoporosis is a disease in which the bones lose minerals and strength with aging. This can result in serious bone fractures or breaks. The risk of osteoporosis can be identified using a bone density scan. Women ages 87 years and over and women at risk for fractures or osteoporosis should discuss screening with their health care providers. Ask your health care provider whether you should take a calcium supplement or vitamin D Rocchio reduce the rate of osteoporosis.  Menopause can be associated with physical symptoms and risks. Hormone replacement therapy is available Milius decrease symptoms and risks. You should talk Ewer your health care provider about whether hormone replacement therapy is right for you.  Use sunscreen. Apply sunscreen liberally and repeatedly throughout the day. You should seek shade when your shadow is shorter than you. Protect yourself by wearing long sleeves, pants, a wide-brimmed hat, and sunglasses year round, whenever you are outdoors.  Once a month, do a whole body skin exam, using a mirror Garguilo look  at the skin on your back. Tell your health care provider of new moles, moles that have irregular borders, moles that are larger than a pencil eraser, or moles that have changed in shape or color.  Stay current with required vaccines (immunizations).  Influenza vaccine. All adults should be immunized every year.  Tetanus, diphtheria, and acellular pertussis (Td, Tdap) vaccine. Pregnant women should receive 1 dose of Tdap vaccine during each pregnancy. The dose should be obtained regardless of the length of time since the last dose. Immunization is preferred during the 27th-36th week of gestation. An adult who has not previously received Tdap or who does not know her vaccine status should receive 1 dose of Tdap. This initial dose should be followed by tetanus and diphtheria toxoids (Td) booster doses every 10 years. Adults with an unknown or incomplete history of completing a 3-dose immunization series with Td-containing vaccines should begin or complete a primary immunization series including a Tdap dose. Adults should receive a Td booster every 10 years.  Varicella vaccine. An adult without evidence of immunity Kittel varicella should receive 2 doses or a second dose if she has previously received 1 dose. Pregnant females who do not have evidence of immunity should receive the first dose after pregnancy. This first dose should be obtained before leaving the health care facility. The second dose should be obtained 4-8 weeks after the first dose.  Human papillomavirus (HPV) vaccine. Females aged 13-26 years who have not received the vaccine previously should obtain the 3-dose series. The vaccine is not recommended for use in pregnant females. However, pregnancy testing is not needed before receiving a dose. If a female is found Pai be pregnant after receiving a dose, no treatment is needed. In that case, the remaining doses should be delayed until after the pregnancy. Immunization is recommended for any person  with an immunocompromised condition through the age of 24 years if she did not get any or all doses earlier. During the 3-dose series, the second dose should be obtained 4-8 weeks after the first dose. The third dose should be obtained 24 weeks after the first dose and 16 weeks after the second dose.  Zoster vaccine. One dose is recommended for adults aged 45  years or older unless certain conditions are present.  Measles, mumps, and rubella (MMR) vaccine. Adults born before 1957 generally are considered immune Benoist measles and mumps. Adults born in 1957 or later should have 1 or more doses of MMR vaccine unless there is a contraindication Zolman the vaccine or there is laboratory evidence of immunity Fredericks each of the three diseases. A routine second dose of MMR vaccine should be obtained at least 28 days after the first dose for students attending postsecondary schools, health care workers, or international travelers. People who received inactivated measles vaccine or an unknown type of measles vaccine during 1963-1967 should receive 2 doses of MMR vaccine. People who received inactivated mumps vaccine or an unknown type of mumps vaccine before 1979 and are at high risk for mumps infection should consider immunization with 2 doses of MMR vaccine. For females of childbearing age, rubella immunity should be determined. If there is no evidence of immunity, females who are not pregnant should be vaccinated. If there is no evidence of immunity, females who are pregnant should delay immunization until after pregnancy. Unvaccinated health care workers born before 1957 who lack laboratory evidence of measles, mumps, or rubella immunity or laboratory confirmation of disease should consider measles and mumps immunization with 2 doses of MMR vaccine or rubella immunization with 1 dose of MMR vaccine.  Pneumococcal 13-valent conjugate (PCV13) vaccine. When indicated, a person who is uncertain of his immunization history and has  no record of immunization should receive the PCV13 vaccine. All adults 65 years of age and older should receive this vaccine. An adult aged 19 years or older who has certain medical conditions and has not been previously immunized should receive 1 dose of PCV13 vaccine. This PCV13 should be followed with a dose of pneumococcal polysaccharide (PPSV23) vaccine. Adults who are at high risk for pneumococcal disease should obtain the PPSV23 vaccine at least 8 weeks after the dose of PCV13 vaccine. Adults older than 47 years of age who have normal immune system function should obtain the PPSV23 vaccine dose at least 1 year after the dose of PCV13 vaccine.  Pneumococcal polysaccharide (PPSV23) vaccine. When PCV13 is also indicated, PCV13 should be obtained first. All adults aged 65 years and older should be immunized. An adult younger than age 65 years who has certain medical conditions should be immunized. Any person who resides in a nursing home or long-term care facility should be immunized. An adult smoker should be immunized. People with an immunocompromised condition and certain other conditions should receive both PCV13 and PPSV23 vaccines. People with human immunodeficiency virus (HIV) infection should be immunized as soon as possible after diagnosis. Immunization during chemotherapy or radiation therapy should be avoided. Routine use of PPSV23 vaccine is not recommended for American Indians, [place] Natives, or people younger than 65 years unless there are medical conditions that require PPSV23 vaccine. When indicated, people who have unknown immunization and have no record of immunization should receive PPSV23 vaccine. One-time revaccination 5 years after the first dose of PPSV23 is recommended for people aged 19-64 years who have chronic kidney failure, nephrotic syndrome, asplenia, or immunocompromised conditions. People who received 1-2 doses of PPSV23 before age 65 years should receive another dose of PPSV23  vaccine at age 65 years or later if at least 5 years have passed since the previous dose. Doses of PPSV23 are not needed for people immunized with PPSV23 at or after age 65 years.  Meningococcal vaccine. Adults with asplenia or persistent complement   component deficiencies should receive 2 doses of quadrivalent meningococcal conjugate (MenACWY-D) vaccine. The doses should be obtained at least 2 months apart. Microbiologists working with certain meningococcal bacteria, military recruits, people at risk during an outbreak, and people who travel Mainwaring or live in countries with a high rate of meningitis should be immunized. A first-year college student up through age 21 years who is living in a residence hall should receive a dose if she did not receive a dose on or after her 16th birthday. Adults who have certain high-risk conditions should receive one or more doses of vaccine.  Hepatitis A vaccine. Adults who wish Winther be protected from this disease, have certain high-risk conditions, work with hepatitis A-infected animals, work in hepatitis A research labs, or travel Millirons or work in countries with a high rate of hepatitis A should be immunized. Adults who were previously unvaccinated and who anticipate close contact with an international adoptee during the first 60 days after arrival in the [place] from a country with a high rate of hepatitis A should be immunized.  Hepatitis B vaccine. Adults who wish Magos be protected from this disease, have certain high-risk conditions, may be exposed Shevlin blood or other infectious body fluids, are household contacts or sex partners of hepatitis B positive people, are clients or workers in certain care facilities, or travel Muraoka or work in countries with a high rate of hepatitis B should be immunized.  Haemophilus influenzae type b (Hib) vaccine. A previously unvaccinated person with asplenia or sickle cell disease or having a scheduled splenectomy should receive 1 dose of Hib  vaccine. Regardless of previous immunization, a recipient of a hematopoietic stem cell transplant should receive a 3-dose series 6-12 months after her successful transplant. Hib vaccine is not recommended for adults with HIV infection. Preventive Services / Frequency Ages 19 Ohair 39 years  Blood pressure check.** / Every 3-5 years.  Lipid and cholesterol check.** / Every 5 years beginning at age 20.  Clinical breast exam.** / Every 3 years for women in their 20s and 30s.  BRCA-related cancer risk assessment.** / For women who have family members with a BRCA-related cancer (breast, ovarian, tubal, or peritoneal cancers).  Pap test.** / Every 2 years from ages 21 through 29. Every 3 years starting at age 30 through age 65 or 70 with a history of 3 consecutive normal Pap tests.  HPV screening.** / Every 3 years from ages 30 through ages 65 Cannell 70 with a history of 3 consecutive normal Pap tests.  Hepatitis C blood test.** / For any individual with known risks for hepatitis C.  Skin self-exam. / Monthly.  Influenza vaccine. / Every year.  Tetanus, diphtheria, and acellular pertussis (Tdap, Td) vaccine.** / Consult your health care provider. Pregnant women should receive 1 dose of Tdap vaccine during each pregnancy. 1 dose of Td every 10 years.  Varicella vaccine.** / Consult your health care provider. Pregnant females who do not have evidence of immunity should receive the first dose after pregnancy.  HPV vaccine. / 3 doses over 6 months, if 26 and younger. The vaccine is not recommended for use in pregnant females. However, pregnancy testing is not needed before receiving a dose.  Measles, mumps, rubella (MMR) vaccine.** / You need at least 1 dose of MMR if you were born in 1957 or later. You may also need a 2nd dose. For females of childbearing age, rubella immunity should be determined. If there is no evidence of immunity, females   who are not pregnant should be vaccinated. If there is no  evidence of immunity, females who are pregnant should delay immunization until after pregnancy.  Pneumococcal 13-valent conjugate (PCV13) vaccine.** / Consult your health care provider.  Pneumococcal polysaccharide (PPSV23) vaccine.** / 1 Base 2 doses if you smoke cigarettes or if you have certain conditions.  Meningococcal vaccine.** / 1 dose if you are age 19 Ozawa 21 years and a first-year college student living in a residence hall, or have one of several medical conditions, you need Hight get vaccinated against meningococcal disease. You may also need additional booster doses.  Hepatitis A vaccine.** / Consult your health care provider.  Hepatitis B vaccine.** / Consult your health care provider.  Haemophilus influenzae type b (Hib) vaccine.** / Consult your health care provider. Ages 40 Denzler 64 years  Blood pressure check.** / Every year.  Lipid and cholesterol check.** / Every 5 years beginning at age 20 years.  Lung cancer screening. / Every year if you are aged 55-80 years and have a 30-pack-year history of smoking and currently smoke or have quit within the past 15 years. Yearly screening is stopped once you have quit smoking for at least 15 years or develop a health problem that would prevent you from having lung cancer treatment.  Clinical breast exam.** / Every year after age 40 years.  BRCA-related cancer risk assessment.** / For women who have family members with a BRCA-related cancer (breast, ovarian, tubal, or peritoneal cancers).  Mammogram.** / Every year beginning at age 40 years and continuing for as long as you are in good health. Consult with your health care provider.  Pap test.** / Every 3 years starting at age 30 years through age 65 or 70 years with a history of 3 consecutive normal Pap tests.  HPV screening.** / Every 3 years from ages 30 years through ages 65 Peden 70 years with a history of 3 consecutive normal Pap tests.  Fecal occult blood test (FOBT) of stool. /  Every year beginning at age 50 years and continuing until age 75 years. You may not need Goodine do this test if you get a colonoscopy every 10 years.  Flexible sigmoidoscopy or colonoscopy.** / Every 5 years for a flexible sigmoidoscopy or every 10 years for a colonoscopy beginning at age 50 years and continuing until age 75 years.  Hepatitis C blood test.** / For all people born from 1945 through 1965 and any individual with known risks for hepatitis C.  Skin self-exam. / Monthly.  Influenza vaccine. / Every year.  Tetanus, diphtheria, and acellular pertussis (Tdap/Td) vaccine.** / Consult your health care provider. Pregnant women should receive 1 dose of Tdap vaccine during each pregnancy. 1 dose of Td every 10 years.  Varicella vaccine.** / Consult your health care provider. Pregnant females who do not have evidence of immunity should receive the first dose after pregnancy.  Zoster vaccine.** / 1 dose for adults aged 60 years or older.  Measles, mumps, rubella (MMR) vaccine.** / You need at least 1 dose of MMR if you were born in 1957 or later. You may also need a second dose. For females of childbearing age, rubella immunity should be determined. If there is no evidence of immunity, females who are not pregnant should be vaccinated. If there is no evidence of immunity, females who are pregnant should delay immunization until after pregnancy.  Pneumococcal 13-valent conjugate (PCV13) vaccine.** / Consult your health care provider.  Pneumococcal polysaccharide (PPSV23) vaccine.** / 1   Geisler 2 doses if you smoke cigarettes or if you have certain conditions.  Meningococcal vaccine.** / Consult your health care provider.  Hepatitis A vaccine.** / Consult your health care provider.  Hepatitis B vaccine.** / Consult your health care provider.  Haemophilus influenzae type b (Hib) vaccine.** / Consult your health care provider. Ages 33 years and over  Blood pressure check.** / Every year.  Lipid  and cholesterol check.** / Every 5 years beginning at age 25 years.  Lung cancer screening. / Every year if you are aged 67-80 years and have a 30-pack-year history of smoking and currently smoke or have quit within the past 15 years. Yearly screening is stopped once you have quit smoking for at least 15 years or develop a health problem that would prevent you from having lung cancer treatment.  Clinical breast exam.** / Every year after age 39 years.  BRCA-related cancer risk assessment.** / For women who have family members with a BRCA-related cancer (breast, ovarian, tubal, or peritoneal cancers).  Mammogram.** / Every year beginning at age 56 years and continuing for as long as you are in good health. Consult with your health care provider.  Pap test.** / Every 3 years starting at age 22 years through age 74 or 48 years with 3 consecutive normal Pap tests. Testing can be stopped between 65 and 70 years with 3 consecutive normal Pap tests and no abnormal Pap or HPV tests in the past 10 years.  HPV screening.** / Every 3 years from ages 69 years through ages 76 or 69 years with a history of 3 consecutive normal Pap tests. Testing can be stopped between 65 and 70 years with 3 consecutive normal Pap tests and no abnormal Pap or HPV tests in the past 10 years.  Fecal occult blood test (FOBT) of stool. / Every year beginning at age 43 years and continuing until age 59 years. You may not need Fridman do this test if you get a colonoscopy every 10 years.  Flexible sigmoidoscopy or colonoscopy.** / Every 5 years for a flexible sigmoidoscopy or every 10 years for a colonoscopy beginning at age 78 years and continuing until age 88 years.  Hepatitis C blood test.** / For all people born from [birth year] through 1967 and any individual with known risks for hepatitis C.  Osteoporosis screening.** / A one-time screening for women ages 71 years and over and women at risk for fractures or osteoporosis.  Skin  self-exam. / Monthly.  Influenza vaccine. / Every year.  Tetanus, diphtheria, and acellular pertussis (Tdap/Td) vaccine.** / 1 dose of Td every 10 years.  Varicella vaccine.** / Consult your health care provider.  Zoster vaccine.** / 1 dose for adults aged 62 years or older.  Pneumococcal 13-valent conjugate (PCV13) vaccine.** / Consult your health care provider.  Pneumococcal polysaccharide (PPSV23) vaccine.** / 1 dose for all adults aged 32 years and older.  Meningococcal vaccine.** / Consult your health care provider.  Hepatitis A vaccine.** / Consult your health care provider.  Hepatitis B vaccine.** / Consult your health care provider.  Haemophilus influenzae type b (Hib) vaccine.** / Consult your health care provider. ** Family history and personal history of risk and conditions may change your health care provider's recommendations.   This information is not intended Petrov replace advice given [place] you by your health care provider. Make sure you discuss any questions you have with your health care provider.   Document Released: 04/24/2001 Document Revised: 03/19/2014 Document Reviewed: 07/24/2010 Elsevier Interactive  Patient Education 2016 [employer].

## 2015-12-13 NOTE — Progress Notes (Signed)
Pre visit review using our clinic review tool, if applicable. No additional management support is needed unless otherwise documented below in the visit note. 

## 2015-12-13 NOTE — Assessment & Plan Note (Signed)
Describes fine red itchy rash after eating fish sauce.

## 2015-12-13 NOTE — Assessment & Plan Note (Signed)
Patient encouraged Cuartas maintain heart healthy diet, regular exercise, adequate sleep. Consider daily probiotics. Take medications as prescribed 

## 2015-12-14 NOTE — Assessment & Plan Note (Signed)
Pap today, no concerns on exam.  

## 2015-12-15 LAB — CYTOLOGY - PAP

## 2015-12-20 ENCOUNTER — Ambulatory Visit (HOSPITAL_BASED_OUTPATIENT_CLINIC_OR_DEPARTMENT_OTHER)
Admission: RE | Admit: 2015-12-20 | Discharge: 2015-12-20 | Disposition: A | Discharge status: Home / Self Care | Payer: BLUE CROSS/BLUE SHIELD | Source: Ambulatory Visit | Attending: Family Medicine | Admitting: Family Medicine

## 2015-12-20 DIAGNOSIS — Z1231 Encounter for screening mammogram for malignant neoplasm of breast: Secondary | ICD-10-CM | POA: Diagnosis present

## 2015-12-20 DIAGNOSIS — Z1239 Encounter for other screening for malignant neoplasm of breast: Secondary | ICD-10-CM

## 2016-12-17 ENCOUNTER — Encounter: Payer: Self-pay | Admitting: Family Medicine

## 2016-12-17 ENCOUNTER — Ambulatory Visit (INDEPENDENT_AMBULATORY_CARE_PROVIDER_SITE_OTHER): Payer: BLUE CROSS/BLUE SHIELD | Admitting: Family Medicine

## 2016-12-17 VITALS — BP 104/66 | HR 70 | Temp 97.8°F | Ht <= 58 in | Wt 102.0 lb

## 2016-12-17 DIAGNOSIS — Z1211 Encounter for screening for malignant neoplasm of colon: Secondary | ICD-10-CM

## 2016-12-17 DIAGNOSIS — M545 Low back pain, unspecified: Secondary | ICD-10-CM

## 2016-12-17 DIAGNOSIS — Z1239 Encounter for other screening for malignant neoplasm of breast: Secondary | ICD-10-CM

## 2016-12-17 DIAGNOSIS — Z1231 Encounter for screening mammogram for malignant neoplasm of breast: Secondary | ICD-10-CM | POA: Diagnosis not present

## 2016-12-17 DIAGNOSIS — R42 Dizziness and giddiness: Secondary | ICD-10-CM | POA: Diagnosis not present

## 2016-12-17 DIAGNOSIS — R350 Frequency of micturition: Secondary | ICD-10-CM

## 2016-12-17 DIAGNOSIS — R634 Abnormal weight loss: Secondary | ICD-10-CM | POA: Diagnosis not present

## 2016-12-17 DIAGNOSIS — Z0000 Encounter for general adult medical examination without abnormal findings: Secondary | ICD-10-CM

## 2016-12-17 DIAGNOSIS — J309 Allergic rhinitis, unspecified: Secondary | ICD-10-CM

## 2016-12-17 HISTORY — DX: Dizziness and giddiness: R42

## 2016-12-17 HISTORY — DX: Abnormal weight loss: R63.4

## 2016-12-17 HISTORY — DX: Frequency of micturition: R35.0

## 2016-12-17 HISTORY — DX: Low back pain, unspecified: M54.50

## 2016-12-17 HISTORY — DX: Allergic rhinitis, unspecified: J30.9

## 2016-12-17 LAB — POCT URINALYSIS DIPSTICK
Bilirubin, UA: NEGATIVE
Blood, UA: NEGATIVE
Glucose, UA: NEGATIVE
KETONES UA: NEGATIVE
Leukocytes, UA: NEGATIVE
Nitrite, UA: NEGATIVE
PH UA: 6 (ref 5.0–8.0)
Protein, UA: NEGATIVE
Spec Grav, UA: 1.015 (ref 1.010–1.025)
UROBILINOGEN UA: 0.2 U/dL

## 2016-12-17 LAB — COMPREHENSIVE METABOLIC PANEL
ALT: 11 U/L (ref 0–35)
AST: 13 U/L (ref 0–37)
Albumin: 4.4 g/dL (ref 3.5–5.2)
Alkaline Phosphatase: 52 U/L (ref 39–117)
BILIRUBIN TOTAL: 0.4 mg/dL (ref 0.2–1.2)
BUN: 15 mg/dL (ref 6–23)
CALCIUM: 9.8 mg/dL (ref 8.4–10.5)
CHLORIDE: 107 meq/L (ref 96–112)
CO2: 30 mEq/L (ref 19–32)
Creatinine, Ser: 0.6 mg/dL (ref 0.40–1.20)
GFR: 113.29 mL/min (ref >60.00)
Glucose, Bld: 98 mg/dL (ref 70–99)
Potassium: 4.5 mEq/L (ref 3.5–5.1)
Sodium: 143 mEq/L (ref 135–145)
Total Protein: 7.2 g/dL (ref 6.0–8.3)

## 2016-12-17 LAB — CBC
HCT: 42 % (ref 36.0–46.0)
HEMOGLOBIN: 14 g/dL (ref 12.0–15.0)
MCHC: 33.5 g/dL (ref 30.0–36.0)
MCV: 91.4 fl (ref 78.0–100.0)
PLATELETS: 307 10*3/uL (ref 150.0–400.0)
RBC: 4.59 Mil/uL (ref 3.87–5.11)
RDW: 13 % (ref 11.5–15.5)
WBC: 5.6 10*3/uL (ref 4.0–10.5)

## 2016-12-17 LAB — SEDIMENTATION RATE: Sed Rate: 6 mm/hr (ref 0–20)

## 2016-12-17 LAB — TSH: TSH: 1.37 u[IU]/mL (ref 0.35–4.50)

## 2016-12-17 NOTE — Assessment & Plan Note (Signed)
Mostly nasal symptoms, has tried Careers adviser and Claritin in past with some relief. Take daily Claritin then on a bad day can add a second dose. Flonase prn, nasal saline

## 2016-12-17 NOTE — Assessment & Plan Note (Addendum)
Patient encouraged Burgeson maintain heart healthy diet, regular exercise, adequate sleep. Consider daily probiotics. Take medications as prescribed. MM ordered today.

## 2016-12-17 NOTE — Progress Notes (Signed)
Subjective:  I acted as a Neurosurgeon for Dr. Holland Falling, CMA   Patient ID: Holly Lawrence, female    DOB: [birth year], 46 y.o.   MRN: [id number]  Chief Complaint  Patient presents with  . Annual Exam    Pt comes in today for her annual physical.   . Weight Loss    Pt states that within the last month she's been losing weight suddenly and feeling fatigued.   . Labs Only    Pt states that she wants her liver tested due Sedita darkening around her mouth  . Urinary Tract Infection    Lower back pain and urinary frequent    HPI  Patient is in today for her annual exam and is accompanied by her daughter. English is the second language so her daughter helps with language translation. She has numerous complaints. She notes weight loss.over past few months. She notes she gets hungry but then she feels full quickly. Then gets hungry a few hours later. No nausea or vomiting. No change in bowel habits. No recent febrile illness or hospitalizations. She is also endorsing some sense of light headedness upon arising. She admits Dumont poor fluid intake. 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 Her daughter acknowledges patient seems stressed about work frequently. Patient also notes a month or so of low back pain and some urinary frequency. Denies CP/palp/SOB/HA/congestion/fevers. Taking meds as prescribed  Patient Care Team: Bradd Canary, MD as PCP - General (Family Medicine)   Past Medical History:  Diagnosis Date  . Allergic rhinitis 12/17/2016  . Allergy   . Arthritis   . Cervical cancer screening 12/13/2015  . Dermatitis 08/22/2014  . Dizziness 12/17/2016  . Low back pain 12/17/2016  . Neck pain, acute 12/13/2015  . Preventative health care 08/22/2014  . Urinary frequency 12/17/2016  . Weight loss 12/17/2016    Past Surgical History:  Procedure Laterality Date  . BREAST REDUCTION SURGERY      Family History  Problem Relation Age of Onset  . Arthritis Son   . COPD Maternal Grandmother     Social  History   Social History  . Marital status: Married    Spouse name: N/A  . Number of children: N/A  . Years of education: N/A   Occupational History  . Nail Technician    Social History Main Topics  . Smoking status: Never Smoker  . Smokeless tobacco: Never Used  . Alcohol use 0.0 oz/week  . Drug use: No  . Sexual activity: Not on file     Comment: lives with husband, children. 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 Not on file   Social History Narrative  . No narrative on file    Outpatient Medications Prior [place] Visit  Medication Sig Dispense Refill  . tiZANidine (ZANAFLEX) 4 MG tablet Take 1 tablet (4 mg total) by mouth every 8 (eight) hours as needed for muscle spasms. (Patient not taking: Reported on 12/17/2016) 30 tablet 0   No facility-administered medications prior [place] visit.     Allergies  Allergen Reactions  . Fish Allergy Rash    itching    Review of Systems  Constitutional: Positive for malaise/fatigue and weight loss. Negative for chills and fever.  HENT: Negative for congestion and hearing loss.   Eyes: Negative for discharge.  Respiratory: Negative for cough, sputum production and shortness of breath.   Cardiovascular: Negative for chest pain, palpitations and leg  swelling.  Gastrointestinal: Negative for abdominal pain, blood in stool, constipation, diarrhea, heartburn, nausea and vomiting.  Genitourinary: Positive for frequency. Negative for dysuria, flank pain, hematuria and urgency.  Musculoskeletal: Positive for back pain. Negative for falls and myalgias.  Skin: Negative for rash.  Neurological: Negative for dizziness, sensory change, loss of consciousness, weakness and headaches.  Endo/Heme/Allergies: Negative for environmental allergies. Does not bruise/bleed easily.  Psychiatric/Behavioral: Negative for depression and suicidal ideas. The patient is not nervous/anxious and does not have insomnia.          Objective:    Physical Exam  Constitutional: She is oriented Rodino person, place, and time. She appears well-developed and well-nourished. No distress.  HENT:  Head: Normocephalic and atraumatic.  Eyes: Conjunctivae are normal.  Neck: Neck supple. No thyromegaly present.  Cardiovascular: Normal rate, regular rhythm and normal heart sounds.   No murmur heard. Pulmonary/Chest: Effort normal and breath sounds normal. No respiratory distress.  Abdominal: Soft. Bowel sounds are normal. She exhibits no distension and no mass. There is no tenderness.  Musculoskeletal: She exhibits no edema.  Lymphadenopathy:    She has no cervical adenopathy.  Neurological: She is alert and oriented Zerby person, place, and time.  Skin: Skin is warm and dry.  Psychiatric: She has a normal mood and affect. Her behavior is normal.    BP 104/66   Pulse 70   Temp 97.8 F (36.6 C) (Oral)   Ht  (1.448 m)   Wt 102 lb (46.3 kg)   BMI 22.07 kg/m  Wt Readings from Last 3 Encounters:  12/17/16 102 lb (46.3 kg)  12/13/15 107 lb 6.4 oz (48.7 kg)  08/10/14 109 lb 8 oz (49.7 kg)   BP Readings from Last 3 Encounters:  12/17/16 104/66  12/13/15 (!) 82/60  08/10/14 97/72     Immunization History  Administered Date(s) Administered  . Influenza,inj,Quad PF,6+ Mos 12/13/2015  . Tdap 12/13/2015    Health Maintenance  Topic Date Due  . HIV Screening  09/19/1983  . INFLUENZA VACCINE  10/10/2016  . PAP SMEAR  12/13/2018  . TETANUS/TDAP  12/12/2025    Lab Results  Component Value Date   WBC 8.3 08/10/2014   HGB 14.4 08/10/2014   HCT 43.3 08/10/2014   PLT 335.0 08/10/2014   GLUCOSE 82 08/10/2014   CHOL 139 08/10/2014   TRIG 50.0 08/10/2014   HDL 49.90 08/10/2014   LDLCALC 79 08/10/2014   ALT 15 08/10/2014   AST 19 08/10/2014   NA 140 08/10/2014   K 4.0 08/10/2014   CL 108 08/10/2014   CREATININE 0.63 08/10/2014   BUN 18 08/10/2014   CO2 25 08/10/2014   TSH 1.55 08/10/2014    Lab Results   Component Value Date   TSH 1.55 08/10/2014   Lab Results  Component Value Date   WBC 8.3 08/10/2014   HGB 14.4 08/10/2014   HCT 43.3 08/10/2014   MCV 92.6 08/10/2014   PLT 335.0 08/10/2014   Lab Results  Component Value Date   NA 140 08/10/2014   K 4.0 08/10/2014   CO2 25 08/10/2014   GLUCOSE 82 08/10/2014   BUN 18 08/10/2014   CREATININE 0.63 08/10/2014   BILITOT 0.4 08/10/2014   ALKPHOS 47 08/10/2014   AST 19 08/10/2014   ALT 15 08/10/2014   PROT 7.1 08/10/2014   ALBUMIN 4.3 08/10/2014   CALCIUM 9.3 08/10/2014   GFR 108.18 08/10/2014   Lab Results  Component Value Date   CHOL 139  08/10/2014   Lab Results  Component Value Date   HDL 49.90 08/10/2014   Lab Results  Component Value Date   LDLCALC 79 08/10/2014   Lab Results  Component Value Date   TRIG 50.0 08/10/2014   Lab Results  Component Value Date   CHOLHDL 3 08/10/2014   No results found for: HGBA1C       Assessment & Plan:   Problem List Items Addressed This Visit    Preventative health care    Patient encouraged Frid maintain heart healthy diet, regular exercise, adequate sleep. Consider daily probiotics. Take medications as prescribed. MM ordered today.       Allergic rhinitis    Mostly nasal symptoms, has tried Allegra and Claritin in past with some relief. Take daily Claritin then on a bad day can add a second dose. Flonase prn, nasal saline      Weight loss    Has noted a decrease in appetite with weight loss since last visit. Check labs. Gets hungry but then gets full quickly for past month or so. If worsens or new symptoms develop let us know otherwise follow upin 8 weeks      Dizziness    Has been happening for about a month as well. Comes and goes. Happens upon arising and more often when hungry. The room spins when she arises quickl. Encouraged Harron increase hydration and eat small frequent meals with lean proteins and complex carbs.       Urinary frequency    Check UA with c and  s      Low back pain - Primary    1 month, constant. No change with positions. Check an xray and report worsening symptoms. Encouraged moist heat and gentle stretching as tolerated. May try NSAIDs and prescription meds as directed and report if symptoms worsen or seek immediate care      Relevant Orders   POCT Urinalysis Dipstick (Completed)   CBC   Comprehensive metabolic panel   Urine Culture   TSH   Sedimentation rate   DG Lumbar Spine Complete    Other Visit Diagnoses    Breast cancer screening       Relevant Orders   MM DIGITAL SCREENING BILATERAL   Colon cancer screening       Relevant Orders   Ambulatory referral [facility]      I have discontinued Ms. Grismer's tiZANidine.  No orders of the defined types were placed in this encounter.   CMA served as Neurosurgeon during this visit. History, Physical and Plan performed by medical provider. Documentation and orders reviewed and attested [place]ub[place].  Danise Edge, MD

## 2016-12-17 NOTE — Assessment & Plan Note (Addendum)
1 month, constant. No change with positions. Check an xray and report worsening symptoms. Encouraged moist heat and gentle stretching as tolerated. May try NSAIDs and prescription meds as directed and report if symptoms worsen or seek immediate care

## 2016-12-17 NOTE — Assessment & Plan Note (Signed)
Check UA with c and s 

## 2016-12-17 NOTE — Patient Instructions (Signed)
Drink 64 oz of clear fluids daily extra if drink caffeine and or alcohol. Try Cristo drink a glass of Gatorade. Preventive Care 40-64 Years, Female Preventive care refers Swamy lifestyle choices and visits with your health care provider that can promote health and wellness. What does preventive care include?  A yearly physical exam. This is also called an annual well check.  Dental exams once or twice a year.  Routine eye exams. Ask your health care provider how often you should have your eyes checked.  Personal lifestyle choices, including: ? Daily care of your teeth and gums. ? Regular physical activity. ? Eating a healthy diet. ? Avoiding tobacco and drug use. ? Limiting alcohol use. ? Practicing safe sex. ? Taking low-dose aspirin daily starting at age 62. ? Taking vitamin and mineral supplements as recommended by your health care provider. What happens during an annual well check? The services and screenings done by your health care provider during your annual well check will depend on your age, overall health, lifestyle risk factors, and family history of disease. Counseling Your health care provider may ask you questions about your:  Alcohol use.  Tobacco use.  Drug use.  Emotional well-being.  Home and relationship well-being.  Sexual activity.  Eating habits.  Work and work Astronomer.  Method of birth control.  Menstrual cycle.  Pregnancy history.  Screening You may have the following tests or measurements:  Height, weight, and BMI.  Blood pressure.  Lipid and cholesterol levels. These may be checked every 5 years, or more frequently if you are over 37 years old.  Skin check.  Lung cancer screening. You may have this screening every year starting at age 71 if you have a 30-pack-year history of smoking and currently smoke or have quit within the past 15 years.  Fecal occult blood test (FOBT) of the stool. You may have this test every year starting at age  97.  Flexible sigmoidoscopy or colonoscopy. You may have a sigmoidoscopy every 5 years or a colonoscopy every 10 years starting at age 21.  Hepatitis C blood test.  Hepatitis B blood test.  Sexually transmitted disease (STD) testing.  Diabetes screening. This is done by checking your blood sugar (glucose) after you have not eaten for a while (fasting). You may have this done every 1-3 years.  Mammogram. This may be done every 1-2 years. Talk Stukes your health care provider about when you should start having regular mammograms. This may depend on whether you have a family history of breast cancer.  BRCA-related cancer screening. This may be done if you have a family history of breast, ovarian, tubal, or peritoneal cancers.  Pelvic exam and Pap test. This may be done every 3 years starting at age 40. Starting at age 24, this may be done every 5 years if you have a Pap test in combination with an HPV test.  Bone density scan. This is done Heenan screen for osteoporosis. You may have this scan if you are at high risk for osteoporosis.  Discuss your test results, treatment options, and if necessary, the need for more tests with your health care provider. Vaccines Your health care provider may recommend certain vaccines, such as:  Influenza vaccine. This is recommended every year.  Tetanus, diphtheria, and acellular pertussis (Tdap, Td) vaccine. You may need a Td booster every 10 years.  Varicella vaccine. You may need this if you have not been vaccinated.  Zoster vaccine. You may need this after age 57.  Measles, mumps, and rubella (MMR) vaccine. You may need at least one dose of MMR if you were born in 1957 or later. You may also need a second dose.  Pneumococcal 13-valent conjugate (PCV13) vaccine. You may need this if you have certain conditions and were not previously vaccinated.  Pneumococcal polysaccharide (PPSV23) vaccine. You may need one or two doses if you smoke cigarettes or if you  have certain conditions.  Meningococcal vaccine. You may need this if you have certain conditions.  Hepatitis A vaccine. You may need this if you have certain conditions or if you travel or work in places where you may be exposed Hog[place] hepatitis A.  Hepatitis B vaccine. You may need this if you have certain conditions or if you travel or work in places where you may be exposed Rossini hepatitis B.  Haemophilus influenzae type b (Hib) vaccine. You may need this if you have certain conditions.  Talk Bab[place] your health care provider about which screenings and vaccines you need and how often you need them. This information is not intended Reichardt replace advice given Mallinger you by your health care provider. Make sure you discuss any questions you have with your health care provider. Document Released: 03/25/2015 Document Revised: 11/16/2015 Document Reviewed: 12/28/2014 Elsevier Interactive Patient Education  2017 [employer].

## 2016-12-17 NOTE — Assessment & Plan Note (Signed)
Has been happening for about a month as well. Comes and goes. Happens upon arising and more often when hungry. The room spins when she arises quickl. Encouraged Oshel increase hydration and eat small frequent meals with lean proteins and complex carbs.

## 2016-12-17 NOTE — Assessment & Plan Note (Addendum)
Has noted a decrease in appetite with weight loss since last visit. Check labs. Gets hungry but then gets full quickly for past month or so. If worsens or new symptoms develop let us know otherwise follow upin 8 weeks

## 2016-12-18 LAB — URINE CULTURE
MICRO NUMBER:: 81117305
RESULT: NO GROWTH
SPECIMEN QUALITY:: ADEQUATE

## 2016-12-20 ENCOUNTER — Ambulatory Visit (HOSPITAL_BASED_OUTPATIENT_CLINIC_OR_DEPARTMENT_OTHER)
Admission: RE | Admit: 2016-12-20 | Discharge: 2016-12-20 | Disposition: A | Discharge status: Home / Self Care | Payer: BLUE CROSS/BLUE SHIELD | Source: Ambulatory Visit | Attending: Family Medicine | Admitting: Family Medicine

## 2016-12-20 ENCOUNTER — Encounter (HOSPITAL_BASED_OUTPATIENT_CLINIC_OR_DEPARTMENT_OTHER): Payer: Self-pay

## 2016-12-20 DIAGNOSIS — M545 Low back pain: Secondary | ICD-10-CM | POA: Insufficient documentation

## 2016-12-20 DIAGNOSIS — Z1239 Encounter for other screening for malignant neoplasm of breast: Secondary | ICD-10-CM

## 2016-12-20 DIAGNOSIS — Z1231 Encounter for screening mammogram for malignant neoplasm of breast: Secondary | ICD-10-CM | POA: Diagnosis present

## 2016-12-25 ENCOUNTER — Telehealth: Payer: Self-pay | Admitting: Family Medicine

## 2016-12-25 NOTE — Telephone Encounter (Signed)
Holly Lawrence Daughter [phone]  [facility] - [place], [place] - [address] [phone] (Phone) [phone] (Fax)     [name] called Pelot check on a prescription for Allergy medicine, she said you guys discussed this at last appointment. Please call an advise.

## 2016-12-27 NOTE — Telephone Encounter (Signed)
Did not see in note  Please advise

## 2016-12-27 NOTE — Telephone Encounter (Signed)
Yes just over the counter Cyrtec/Cetirinzine 10 mg once Vanetten twice daily. Should be in her AVS? Sometimes it slips my mind

## 2017-01-01 NOTE — Telephone Encounter (Signed)
Left message for daughter Sweis call the office back

## 2017-01-07 ENCOUNTER — Encounter: Payer: Self-pay | Admitting: Family Medicine

## 2017-01-08 ENCOUNTER — Other Ambulatory Visit: Payer: Self-pay

## 2017-01-08 MED ORDER — CETIRIZINE HCL 10 MG PO TABS: 10.0000 mg | ORAL_TABLET | Freq: Every day | ORAL | 11 refills | Status: DC

## 2017-02-11 ENCOUNTER — Ambulatory Visit: Payer: BLUE CROSS/BLUE SHIELD | Admitting: Family Medicine

## 2017-02-11 ENCOUNTER — Telehealth: Payer: Self-pay | Admitting: *Deleted

## 2017-02-11 NOTE — Telephone Encounter (Signed)
Dr Abner GreenspanBlyth-- please advise. Charge or no charge for late cancellation?  Copied from CRM [phone]. Topic: Quick Communication - Appointment Cancellation >> Feb 11, 2017  8:50 AM Viviann SpareWhite, Selina wrote: Patient called Spieth cancel appointment scheduled for 02/11/17 @ 9:15 am. Patient HAS NOT rescheduled their appointment.  Route [place] department's PEC pool.

## 2017-02-20 ENCOUNTER — Encounter: Payer: Self-pay | Admitting: Family Medicine

## 2018-02-08 ENCOUNTER — Other Ambulatory Visit: Payer: Self-pay | Admitting: Family Medicine

## 2018-10-06 ENCOUNTER — Other Ambulatory Visit: Payer: Self-pay

## 2018-10-07 ENCOUNTER — Other Ambulatory Visit: Payer: Self-pay

## 2018-10-07 ENCOUNTER — Ambulatory Visit: Payer: Self-pay

## 2018-10-07 ENCOUNTER — Ambulatory Visit: Payer: BLUE CROSS/BLUE SHIELD | Admitting: Family Medicine

## 2018-10-07 ENCOUNTER — Encounter: Payer: Self-pay | Admitting: Family Medicine

## 2018-10-07 VITALS — BP 117/75 | HR 93 | Ht 59.0 in | Wt 106.0 lb

## 2018-10-07 DIAGNOSIS — M5412 Radiculopathy, cervical region: Secondary | ICD-10-CM | POA: Diagnosis not present

## 2018-10-07 DIAGNOSIS — M79662 Pain in left lower leg: Secondary | ICD-10-CM

## 2018-10-07 DIAGNOSIS — M25541 Pain in joints of right hand: Secondary | ICD-10-CM | POA: Diagnosis not present

## 2018-10-07 DIAGNOSIS — R200 Anesthesia of skin: Secondary | ICD-10-CM | POA: Insufficient documentation

## 2018-10-07 DIAGNOSIS — M79641 Pain in right hand: Secondary | ICD-10-CM

## 2018-10-07 DIAGNOSIS — M79661 Pain in right lower leg: Secondary | ICD-10-CM | POA: Diagnosis not present

## 2018-10-07 NOTE — Progress Notes (Signed)
Medication Samples have been provided Wieser the patient.  Drug name: Pennsaid       Strength: 2%       Qty: 2 Boxes  LOT: [id number]  Exp.Date: 04/2019  Dosing instructions: use peasize amount and rub gently.  The patient has been instructed regarding the correct time, dose, and frequency of taking this medication, including desired effects and most common side effects.   Sherrie George, MA 11:17 AM 10/07/2018

## 2018-10-07 NOTE — Patient Instructions (Signed)
Nice Tullier meet you I will call you about the lab results from today  Please try the exercises  Please try the rub on medicine. I can send this in if it seems Gaultney help.   Please send me a message in MyChart with any questions or updates.  Please see me back in 3 weeks.  We will schedule a telephone visit once the results of the nerve study are in.   --Dr. Raeford Razor

## 2018-10-07 NOTE — Assessment & Plan Note (Signed)
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  Does not appear Motsinger be involved with the heel spur that was noted and does not appear Chisom be associated with enthesopathy.  Almost seems more restless leg in nature. -Iron and ferritin -Counseled on home exercise therapy and supportive care. -Could consider imaging of the lumbar spine.

## 2018-10-07 NOTE — Assessment & Plan Note (Addendum)
Symptoms in the hands seem Mangrum be radicular in nature.  However this would be several levels involved as Weant the source of her symptoms.  Not likely Brodowski be demyelinating disease with no loss of strength and no atrophy. -Nerve study. -Counseled on supportive care. -Can consider gabapentin -Could consider imaging of the cervical spine

## 2018-10-07 NOTE — Progress Notes (Signed)
Holly Lawrence - 48 y.o. female MRN [id number]  Date of birth: Oct 06, 1970  SUBJECTIVE:  Including CC & ROS.  Chief Complaint  Patient presents with  . Foot Pain    bilateral foot    Holly Lawrence is a 48 y.o. female that is presenting with bilateral upper extremity changes, right hand pain and lower leg pain.  She reports that she has an altered sensation occurring down a sleevelike distribution of the right arm.  She feels it in the palmar aspect of her right hand and radiates proximally.  The right is more significant than the left.  This is acute on chronic in nature.  She reports her symptoms as tingling and intermittent.  She also has clawing of the right hand.  She had Bayly quit her job due Dieu this problem.  She has had injections in the past and not improved her symptoms.  However her symptoms did return in her upper extremities.  She also reports swelling and redness changes of the metacarpal joints of the right hand.  Does not demonstrate other joints with similar problems.  This is intermittent in nature.  She denies any family history of any autoimmune problems.  Has not had any previous work-up.  Symptoms are chronic in nature.  Reports she has pain extending down the posterior aspect of the lower leg.  This is occurring in the calf and into the Achilles of the left and right side.  It is tender Piehl the touch over the mid substance of the Achilles on the left and right.  She gets soreness in feels weak and fatigued in the legs.  She denies any injury or inciting event.  This is acute on chronic in nature.  Is been ongoing for a couple of years.  She denies any regular exercise.  She has tried exercise but the pain interferes with.  The legs almost feel restless at times.   Review of Systems  Constitutional: Negative for fever.  HENT: Negative for congestion.   Respiratory: Negative for cough.   Cardiovascular: Negative for chest pain.  Gastrointestinal: Negative for abdominal distention.   Musculoskeletal: Negative for gait problem.  Skin: Negative for color change.  Neurological: Positive for numbness.  Hematological: Negative for adenopathy.    HISTORY: Past Medical, Surgical, Social, and Family History Reviewed & Updated per EMR.   Pertinent Historical Findings include:  Past Medical History:  Diagnosis Date  . Allergic rhinitis 12/17/2016  . Allergy   . Arthritis   . Cervical cancer screening 12/13/2015  . Dermatitis 08/22/2014  . Dizziness 12/17/2016  . Low back pain 12/17/2016  . Neck pain, acute 12/13/2015  . Preventative health care 08/22/2014  . Urinary frequency 12/17/2016  . Weight loss 12/17/2016    Past Surgical History:  Procedure Laterality Date  . BREAST REDUCTION SURGERY    . REDUCTION MAMMAPLASTY      Allergies  Allergen Reactions  . Fish Allergy Rash    itching    Family History  Problem Relation Age of Onset  . Arthritis Son   . COPD Maternal Grandmother      Social History   Socioeconomic History  . Marital status: Married    Spouse name: Not on file  . Number of children: Not on file  . Years of education: Not on file  . Highest education level: Not on file  Occupational History  . Occupation: Advertising account plannerail Technician  Social Needs  . Financial resource strain: Not on file  . Food insecurity  Worry: Not on file    Inability: Not on file  . Transportation needs    Medical: Not on file    Non-medical: Not on file  Tobacco Use  . Smoking status: Never Smoker  . Smokeless tobacco: Never Used  Substance and Sexual Activity  . Alcohol use: Yes    Alcohol/week: 0.0 standard drinks  . Drug use: No  . Sexual activity: Not on file    Comment: lives with husband, children. 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 Physical activity    Days per week: Not on file    Minutes per session: Not on file  . Stress: Not on file  Relationships  . Social Musicianconnections    Talks on phone: Not on file    Gets  together: Not on file    Attends religious service: Not on file    Active member of club or organization: Not on file    Attends meetings of clubs or organizations: Not on file    Relationship status: Not on file  . Intimate partner violence    Fear of current or ex partner: Not on file    Emotionally abused: Not on file    Physically abused: Not on file    Forced sexual activity: Not on file  Other Topics Concern  . Not on file  Social History Narrative  . Not on file     PHYSICAL EXAM:  VS: BP 117/75   Pulse 93   Ht 4\' 11"  (1.499 m)   Wt 106 lb (48.1 kg)   BMI 21.41 kg/m  Physical Exam Gen: NAD, alert, cooperative with exam, well-appearing ENT: normal lips, normal nasal mucosa,  Eye: normal EOM, normal conjunctiva and lids CV:  no edema, +2 pedal pulses   Resp: no accessory muscle use, non-labored,  Skin: no rashes, no areas of induration  Neuro: normal tone, normal sensation Coreas touch Psych:  normal insight, alert and oriented MSK:  Neck: Normal range of motion. Normal strength resistance with shrug. Negative Spurling's test. Right arm: No redness or effusion noted. Normal shoulder range of motion. Negative empty can test. Normal wrist range of motion. No effusion or redness of the metacarpal joints. Normal grip strength. Normal pincer grasp. No signs of atrophy Normal strength resistance with finger abduction and abduction Normal strength resistance with thumb extension. Right and left leg: No signs of atrophy Normal strength resistance with knee extension and flexion. Normal plantarflexion and dorsiflexion. No redness or swelling over the Achilles or at the insertion. No Haglund's deformity. Mild tenderness Kasal palpation at the mid substance of the Achilles on each leg. Neurovascularly intact  Limited ultrasound: Right hand, right Achilles left Achilles:  Right hand: Normal appearing median nerve in the carpal tunnel. Normal-appearing CMC joint.  Normal-appearing second and third MCP joints with no synovitis or erosions noted  Right Achilles: Heel spur was noted but no significant vascular uptake. Normal-appearing thickness and substance of the right Achilles. No retrocalcaneal bursitis.  Left Achilles: Heel spur noted but no significant vascular uptake. Normal-appearing thickness and substance of the left Achilles. No retrocalcaneal bursitis.  Summary: Mild degenerative changes seen in the hand but nothing Mccown account for her symptoms.  Heel spurs noted in the left and right at the insertion of the Achilles.  Ultrasound and interpretation by Clare GandyJeremy Schmitz, MD     ASSESSMENT & PLAN:   Cervical radiculopathy Symptoms in the hands seem Bozzi be radicular in nature.  However this would be several levels involved as Stiggers the source of her symptoms.  Not likely Lotspeich be demyelinating disease with no loss of strength and no atrophy. -Nerve study. -Counseled on supportive care. -Can consider gabapentin -Could consider imaging of the cervical spine  Pain in both lower legs Clinically it sounds as though she has Achilles tendinopathy but ultrasound was reassuring.  Does not appear Biehler be involved with the heel spur that was noted and does not appear Minar be associated with enthesopathy.  Almost seems more restless leg in nature. -Iron and ferritin -Counseled on home exercise therapy and supportive care. -Could consider imaging of the lumbar spine.  Arthralgia of right hand Reports intermittent redness and swelling of the joints of the hand.  No significant changes noted on ultrasound today. -Uric acid and ANA panel. - pennsaid samples. Can send more in if helps.

## 2018-10-07 NOTE — Assessment & Plan Note (Addendum)
Reports intermittent redness and swelling of the joints of the hand.  No significant changes noted on ultrasound today. -Uric acid and ANA panel. - pennsaid samples. Can send more in if helps.

## 2018-10-09 LAB — IRON,TIBC AND FERRITIN PANEL
Ferritin: 274 ng/mL — ABNORMAL HIGH (ref 15–150)
Iron Saturation: 37 % (ref 15–55)
Iron: 115 ug/dL (ref 27–159)
Total Iron Binding Capacity: 314 ug/dL (ref 250–450)
UIBC: 199 ug/dL (ref 131–425)

## 2018-10-09 LAB — ANA,IFA RA DIAG PNL W/RFLX TIT/PATN
ANA Titer 1: NEGATIVE
Cyclic Citrullin Peptide Ab: 5 units (ref 0–19)
Rhuematoid fact SerPl-aCnc: <10 IU/mL (ref 0.0–13.9)

## 2018-10-09 LAB — URIC ACID: Uric Acid: 4 mg/dL (ref 2.5–7.1)

## 2018-10-10 ENCOUNTER — Ambulatory Visit: Payer: BLUE CROSS/BLUE SHIELD | Admitting: Family Medicine

## 2018-10-13 ENCOUNTER — Telehealth: Payer: Self-pay | Admitting: Family Medicine

## 2018-10-13 NOTE — Telephone Encounter (Signed)
Spoke with patient about the her lab results.  Would redraw the ferritin sometime going forward.  An interpreter was used.  Rosemarie Ax, MD [facility] 10/13/2018, 9:30 AM

## 2018-10-16 ENCOUNTER — Ambulatory Visit (HOSPITAL_BASED_OUTPATIENT_CLINIC_OR_DEPARTMENT_OTHER)
Admission: RE | Admit: 2018-10-16 | Discharge: 2018-10-16 | Disposition: A | Discharge status: Home / Self Care | Payer: BLUE CROSS/BLUE SHIELD | Source: Ambulatory Visit | Attending: Family Medicine | Admitting: Family Medicine

## 2018-10-16 ENCOUNTER — Other Ambulatory Visit: Payer: Self-pay

## 2018-10-16 ENCOUNTER — Encounter: Payer: Self-pay | Admitting: Family Medicine

## 2018-10-16 ENCOUNTER — Other Ambulatory Visit (HOSPITAL_COMMUNITY)
Admission: RE | Admit: 2018-10-16 | Discharge: 2018-10-16 | Disposition: A | Discharge status: Home / Self Care | Payer: BLUE CROSS/BLUE SHIELD | Source: Ambulatory Visit | Attending: Family Medicine | Admitting: Family Medicine

## 2018-10-16 ENCOUNTER — Ambulatory Visit (INDEPENDENT_AMBULATORY_CARE_PROVIDER_SITE_OTHER): Payer: BLUE CROSS/BLUE SHIELD | Admitting: Family Medicine

## 2018-10-16 VITALS — BP 110/70 | HR 72 | Temp 98.2°F | Resp 18 | Wt 106.6 lb

## 2018-10-16 DIAGNOSIS — M542 Cervicalgia: Secondary | ICD-10-CM

## 2018-10-16 DIAGNOSIS — Z0001 Encounter for general adult medical examination with abnormal findings: Secondary | ICD-10-CM

## 2018-10-16 DIAGNOSIS — M2550 Pain in unspecified joint: Secondary | ICD-10-CM | POA: Diagnosis not present

## 2018-10-16 DIAGNOSIS — M545 Low back pain, unspecified: Secondary | ICD-10-CM

## 2018-10-16 DIAGNOSIS — Z1239 Encounter for other screening for malignant neoplasm of breast: Secondary | ICD-10-CM | POA: Diagnosis not present

## 2018-10-16 DIAGNOSIS — M79642 Pain in left hand: Secondary | ICD-10-CM

## 2018-10-16 DIAGNOSIS — R7989 Other specified abnormal findings of blood chemistry: Secondary | ICD-10-CM | POA: Diagnosis not present

## 2018-10-16 DIAGNOSIS — M79661 Pain in right lower leg: Secondary | ICD-10-CM

## 2018-10-16 DIAGNOSIS — Z124 Encounter for screening for malignant neoplasm of cervix: Secondary | ICD-10-CM

## 2018-10-16 DIAGNOSIS — M79641 Pain in right hand: Secondary | ICD-10-CM

## 2018-10-16 DIAGNOSIS — M79672 Pain in left foot: Secondary | ICD-10-CM

## 2018-10-16 DIAGNOSIS — Z0000 Encounter for general adult medical examination without abnormal findings: Secondary | ICD-10-CM

## 2018-10-16 DIAGNOSIS — M79662 Pain in left lower leg: Secondary | ICD-10-CM

## 2018-10-16 DIAGNOSIS — M79671 Pain in right foot: Secondary | ICD-10-CM | POA: Diagnosis not present

## 2018-10-16 DIAGNOSIS — M5412 Radiculopathy, cervical region: Secondary | ICD-10-CM

## 2018-10-16 LAB — CBC WITH DIFFERENTIAL/PLATELET
Basophils Absolute: 0.1 10*3/uL (ref 0.0–0.1)
Basophils Relative: 1.3 % (ref 0.0–3.0)
Eosinophils Absolute: 0.2 10*3/uL (ref 0.0–0.7)
Eosinophils Relative: 4.4 % (ref 0.0–5.0)
HCT: 41.5 % (ref 36.0–46.0)
Hemoglobin: 13.9 g/dL (ref 12.0–15.0)
Lymphocytes Relative: 46.4 % — ABNORMAL HIGH (ref 12.0–46.0)
Lymphs Abs: 2.4 10*3/uL (ref 0.7–4.0)
MCHC: 33.4 g/dL (ref 30.0–36.0)
MCV: 91.9 fl (ref 78.0–100.0)
Monocytes Absolute: 0.4 10*3/uL (ref 0.1–1.0)
Monocytes Relative: 7 % (ref 3.0–12.0)
Neutro Abs: 2.2 10*3/uL (ref 1.4–7.7)
Neutrophils Relative %: 40.9 % — ABNORMAL LOW (ref 43.0–77.0)
Platelets: 334 10*3/uL (ref 150.0–400.0)
RBC: 4.52 Mil/uL (ref 3.87–5.11)
RDW: 13.5 % (ref 11.5–15.5)
WBC: 5.3 10*3/uL (ref 4.0–10.5)

## 2018-10-16 LAB — LIPID PANEL
Cholesterol: 146 mg/dL (ref 0–200)
HDL: 46.6 mg/dL (ref >39.00)
LDL Cholesterol: 74 mg/dL (ref 0–99)
NonHDL: 99.69
Total CHOL/HDL Ratio: 3
Triglycerides: 129 mg/dL (ref 0.0–149.0)
VLDL: 25.8 mg/dL (ref 0.0–40.0)

## 2018-10-16 LAB — COMPREHENSIVE METABOLIC PANEL WITH GFR
ALT: 15 U/L (ref 0–35)
AST: 18 U/L (ref 0–37)
Albumin: 4.5 g/dL (ref 3.5–5.2)
Alkaline Phosphatase: 67 U/L (ref 39–117)
BUN: 11 mg/dL (ref 6–23)
CO2: 26 meq/L (ref 19–32)
Calcium: 9.7 mg/dL (ref 8.4–10.5)
Chloride: 107 meq/L (ref 96–112)
Creatinine, Ser: 0.57 mg/dL (ref 0.40–1.20)
GFR: 112.24 mL/min (ref >60.00)
Glucose, Bld: 83 mg/dL (ref 70–99)
Potassium: 3.9 meq/L (ref 3.5–5.1)
Sodium: 142 meq/L (ref 135–145)
Total Bilirubin: 0.5 mg/dL (ref 0.2–1.2)
Total Protein: 7 g/dL (ref 6.0–8.3)

## 2018-10-16 LAB — CK: Total CK: 67 U/L (ref 7–177)

## 2018-10-16 LAB — URIC ACID: Uric Acid, Serum: 3.6 mg/dL (ref 2.4–7.0)

## 2018-10-16 LAB — SEDIMENTATION RATE: Sed Rate: 19 mm/h (ref 0–30)

## 2018-10-16 LAB — FERRITIN: Ferritin: 150.1 ng/mL (ref 10.0–291.0)

## 2018-10-16 LAB — TSH: TSH: 1.36 u[IU]/mL (ref 0.35–4.50)

## 2018-10-16 NOTE — Patient Instructions (Signed)
Ice, elevate the feet and try applying Aspercreme Lidocaine gel   Vim gn Achille Achilles Tendinitis  Vim gn Achille l tnh tr?ng vim d?i gn c?ng gi?ng nh? dy th?ng n?i cc c? ? b?p chn v?i x??ng gt chn (gn Achille). B?nh ny th??ng do l?m d?ng gn v c? chn gy ra. Vim gn Achille th??ng ?? h?n theo th?i gian khi c ?i?u tr? v t? ch?m [place] t?i nh. C th? m?t vi tu?n ho?c vi thng ?? kh?i h?n. Nguyn nhn g gy ra? Tnh tr?ng ny c th? do:  ??t ng?t t?ng c??ng ?? t?p luy?n ho?c ho?t ??ng, ch?ng h?n nh? ch?y.  T?p cc bi t?p ho?c th?c hi?n cc ho?t ??ng (ch?ng h?n nh? nh?y) gi?ng nhau l?p ?i l?p l?i.  Khng kh?i ??ng cc c? ? b?p chn tr??c khi t?p.  T?p th? d?c b?ng giy b? rch ho?c lo?i giy khng ph?i dng ?? t?p th? d?c.  B? vim kh?p ho?c u x??ng (gai x??ng) ? m?t sau c?a x??ng gt chn. Ph?n ny c th? t? vo gn v lm gn b? t?n th??ng.  Mo?n va? ra?ch lin quan ??n tu?i ta?c. Cc gn tr? nn km linh ho?t theo tu?i tc v d? b? t?n th??ng h?n. Cc d?u hi?u ho?c tri?u ch?ng l g? Nh?ng tri?u ch?ng th??ng g?p c?a tnh tr?ng ny bao g?m:  ?au ? gn [place] ho?c ? m?t sau c?a chn, ngay trn gt chn. ?au th??ng tr?m tr?ng h?n khi t?p luy?n.  C?ng ho?c ?au nh?c ? m?t sau c?a chn, ??c bi?t l vo bu?i sng.  S?ng ? da pha trn gn Achille.  Gn dy ln.  Gai x??ng ? pha d??i gn Achille, g?n gt chn.  Kh ??ng trn ??u ngn chn. Ch?n ?on tnh tr?ng ny nh? th? no? B?nh ny ???c ch?n ?on d?a vo cc tri?u ch?ng c?a qu v? v khm th?c th?Sander Nephew v? c th? ???c lm cc ki?m tra, bao g?m:  Ch?p X quang.  Ch?p MRI. Tnh tr?ng ny ???c ?i?u tr? nh? th? no? M?c tiu ?i?u tr? l gi?m nh? tri?u ch?ng v gip lnh t?n th??ng. ?i?u tr? c th? bao g?m:  Gi?m ho?c ng?ng cc ho?t ??ng gy vim gn. ?i?u ny c th? ngh?a l chuy?n sang cc bi t?p c tc ??ng th?p nh? ??p xe ho?c b?i.  Ch??m ? l?nh cho khu v?c b? t?n th??ng.  Lm v?t l tr? li?u,  bao g?m cc bi t?p t?ng c??ng s?c m?nh v gin c?.  Cc thu?c ch?ng vim khng steroid (NSAID) gip gi?m ?au v gi?m s?ng.  Dng giy nng ??, b?ng qu?n, mi?ng lt nng gt chn, ?ng ?i b? (b?ng b?t kh).  Ph?u thu?t. Ph?u thu?t c th? ???c th?c hi?n n?u cc tri?u ch?ng c?a qu v? khng c?i thi?n sau 6 thng.  Dng cc xung sng xung kch n?ng l??ng cao ?? kch thch qu trnh lnh b?nh (li?u php sng xung kch ngoi c? th?). Tr??ng h?p ny hi?m khi x?y ra.  Tim thu?c gip gi?m vim (corticosteroid). Tr??ng h?p ny hi?m khi x?y ra. Tun th? nh?ng h??ng d?n ny ? nh: N?u qu v? c b?ng b?t kh:  Mang b?ng b?t theo ch? d?n c?a chuyn gia ch?m [place] s?c kh?e. Ch? tho ra theo ch? d?n c?a chuyn gia ch?m [place] s?c kh?e.  N?i l?ng b?ng b?t n?u cc ngn chn b? ?au bu?t, t ho?c tr?? nn l?nh v xanh ti. Ho?t ??  ng  D?n quay l?i cc ho?t ??ng bnh th??ng khi chuyn gia ch?m [place] s?c kh?e cho php. Khng th?c hi?n b?t k? ho?t ??ng no gy ?au. ? Cn nh?c cc bi t?p tc ??ng th?p, nh? ??p xe ho?c b?i.  N?u qu v? dng b?ng b?t kh, ha?y ho?i chuyn gia ch?m so?c s??c kho?e khi na?o qu v? co? th? la?i xe an toa?n.  N?u ????c chi? ?i?nh v?t ly? tri? li?u, ha?y t?p cc bi t?p theo chi? d?n cu?a chuyn gia ch?m so?c s??c kho?e ho?c bc s? v?t l tr? li?u. X? tr ?au, c?ng kh?p v s?ng n?   Nng (nng cao) bn chn quy? vi? ln cao h?n tim khi qu v? ng?i ho?c n?m.  C? ??ng ngn chn th??ng xuyn ?? trnh c?ng kh?p v lm gi?m s?ng.  N?u ???c ch? d?n, ch??m ? l?nh ln vng b? t?n th??ng: ? Cho ? l?nh vo ti ni lng. ? ?? kh?n t?m ? gi?a da v ti ch??m. ? Ch??m ? l?nh trong kho?ng 20 pht, 2-3 l?n m?i ngy. H??ng d?n chung  N?u ???c ch? d?n, qu?n bn chn b?ng b?ng thun ho?c lo?i b?ng qu?n khc. Vi?c ny c th? gi? cho gn c?a qu v? khng c? ??ng qu nhi?u trong khi lnh l?i. Chuyn gia ch?m [place] s?c kh?e s? h??ng d?n qu v? cch qu?n b?ng bn chn ?ng cch.  Ch? ?i giy nng ??  ho?c mi?ng lt nng gt chn theo ch? d?n c?a chuyn gia ch?m [place] s?c kh?e.  Ch? s? d?ng thu?c khng k ??n v thu?c k ??n theo ch? d?n c?a chuyn gia ch?m [place] s?c kh?e.  Tun th? t?t c? cc l?n khm theo di theo ch? d?n c?a chuyn gia ch?m [place] s?c kh?e. ?i?u ny c vai tr quan tr?ng. Hy lin l?c v?i chuyn gia ch?m [place] s?c kh?e n?u:  Qu v? c cc tri?u ch?ng tr?m tr?ng h?n.  Quy? vi? bi? ?au khng ??? sau khi du?ng thu?c.  Qu v? c nh?ng tri?u ch?ng m?i, khng r nguyn nhn.  Qu v? th?y ?m v s?ng ? bn chn.  Qu v? b? s?t. Yu c?u tr? gip ngay l?p t?c n?u:  Qu v? c?m th?y ho?c nghe th?y ti?ng b?p ??t ng?t ? gn [place] sau ? b? ?au r?t nhi?u.  Qu v? khng th? c? ??ng ngn chn ho?c bn chn.  Qu v? khng th? t ? b?t c? l?n no ln bn chn mnh. Tm t?t  Vim gn [place] l tnh tr?ng vim d?i gn c?ng gi?ng nh? dy th?ng n?i cc c? ? b?p chn v?i x??ng gt chn (gn Achille).  Tnh tr?ng ny th??ng do l?m d?ng gn v kh?p m?t c chn gy ra. N c?ng c th? do vim kh?p ho?c qu trnh lo ha bnh th??ng gy ra.  Cc tri?u ch?ng th??ng g?p nh?t c?a tnh tr?ng ny bao g?m ?au, s?ng ho?c c?ng gn [place] ho?c ? m?t sau c?a chn.  Tnh tr?ng ny th??ng ???c ?i?u tr? b?ng cch ngh? ng?i, cc thu?c ch?ng vim khng steroid (NSAID) v v?t l tr? li?u. Thng tin ny khng nh?m m?c ?ch thay th? cho l?i khuyn m chuyn gia ch?m [place] s?c kh?e ni v?i qu v?. Hy b?o ??m qu v? ph?i th?o lu?n b?t k? v?n ?? g m qu v? c v?i chuyn gia ch?m  s?c kh?e c?a qu v?. Document Released: 12/06/2004 Document Revised: 07/12/2016 Document Reviewed: 07/12/2016 Elsevier Patient Education  2020 Elsevier Inc.   

## 2018-10-16 NOTE — Assessment & Plan Note (Signed)
Pap today, no concerns on exam.  

## 2018-10-19 NOTE — Assessment & Plan Note (Signed)
Patient encouraged [place] maintain heart healthy diet, regular exercise, adequate sleep. Consider daily probiotics. Take medications as prescribed. Labs ordered and reviewed. Referred for colonoscopy 

## 2018-10-19 NOTE — Assessment & Plan Note (Signed)
B/l foot pain has been seen by sports medicine and agrees [place] return Arvin them [place] discuss further care of her pain.

## 2018-10-19 NOTE — Assessment & Plan Note (Signed)
Proceed with imaging. Check labs. Encouraged moist heat and gentle stretching as tolerated. May try NSAIDs and prescription meds as directed and report if symptoms worsen or seek immediate care

## 2018-10-19 NOTE — Assessment & Plan Note (Signed)
With difficulty making a fist in the right hand in particular.

## 2018-10-19 NOTE — Progress Notes (Signed)
Subjective:    Patient ID: Holly Lawrence, female    DOB: 1970/04/10, 48 y.o.   MRN: [id number]  No chief complaint on file.   HPI Patient is in today for annual preventative exam and follow up on numerous concerns. She has not been seen in this office in greater than 2 years and the interview is conducted via an interpretor due [place]  Her language barrier. No recent febrile illness or hospitalizations. No polyuria or polydipsia. Needs pap today but no complaints of discharge or febrile illness. Denies CP/palp/SOB/HA/congestion/fevers/GI or GU c/o. Taking meds as prescribed  Past Medical History:  Diagnosis Date  . Allergic rhinitis 12/17/2016  . Allergy   . Arthritis   . Cervical cancer screening 12/13/2015  . Dermatitis 08/22/2014  . Dizziness 12/17/2016  . Low back pain 12/17/2016  . Neck pain, acute 12/13/2015  . Preventative health care 08/22/2014  . Urinary frequency 12/17/2016  . Weight loss 12/17/2016    Past Surgical History:  Procedure Laterality Date  . BREAST REDUCTION SURGERY    . REDUCTION MAMMAPLASTY      Family History  Problem Relation Age of Onset  . Arthritis Son   . COPD Maternal Grandmother     Social History   Socioeconomic History  . Marital status: Married    Spouse name: Not on file  . Number of children: Not on file  . Years of education: Not on file  . Highest education level: Not on file  Occupational History  . Occupation: Advertising account plannerail Technician  Social Needs  . Financial resource strain: Not on file  . Food insecurity    Worry: Not on file    Inability: Not on file  . Transportation needs    Medical: Not on file    Non-medical: Not on file  Tobacco Use  . Smoking status: Never Smoker  . Smokeless tobacco: Never Used  Substance and Sexual Activity  . Alcohol use: Yes    Alcohol/week: 0.0 standard drinks  . Drug use: No  . Sexual activity: Not on file    Comment: lives with husband, children. 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 Physical activity    Days per week: Not on file    Minutes per session: Not on file  . Stress: Not on file  Relationships  . Social Musicianconnections    Talks on phone: Not on file    Gets together: Not on file    Attends religious service: Not on file    Active member of club or organization: Not on file    Attends meetings of clubs or organizations: Not on file    Relationship status: Not on file  . Intimate partner violence    Fear of current or ex partner: Not on file    Emotionally abused: Not on file    Physically abused: Not on file    Forced sexual activity: Not on file  Other Topics Concern  . Not on file  Social History Narrative  . Not on file    Outpatient Medications Prior [facility] Visit  Medication Sig Dispense Refill  . cetirizine (ZYRTEC) 10 MG tablet TAKE 1 TABLET(10 MG) BY MOUTH DAILY 30 tablet 0   No facility-administered medications prior [facility] visit.     Allergies  Allergen Reactions  . Fish Allergy Rash    itching    Review of Systems  Constitutional: Negative for chills, fever and malaise/fatigue.  HENT: Negative for congestion and  hearing loss.   Eyes: Negative for discharge.  Respiratory: Negative for cough, sputum production and shortness of breath.   Cardiovascular: Negative for chest pain, palpitations and leg swelling.  Gastrointestinal: Negative for abdominal pain, blood in stool, constipation, diarrhea, heartburn, nausea and vomiting.  Genitourinary: Negative for dysuria, frequency, hematuria and urgency.  Musculoskeletal: Positive for back pain, joint pain, myalgias and neck pain. Negative for falls.  Skin: Negative for rash.  Neurological: Negative for dizziness, sensory change, loss of consciousness, weakness and headaches.  Endo/Heme/Allergies: Negative for environmental allergies. Does not bruise/bleed easily.  Psychiatric/Behavioral: Negative for depression and suicidal ideas. The patient has insomnia. The patient is not  nervous/anxious.        Objective:    Physical Exam Constitutional:      General: She is not in acute distress.    Appearance: She is well-developed.  HENT:     Head: Normocephalic and atraumatic.  Eyes:     Conjunctiva/sclera: Conjunctivae normal.  Neck:     Musculoskeletal: Neck supple.     Thyroid: No thyromegaly.  Cardiovascular:     Rate and Rhythm: Normal rate and regular rhythm.     Heart sounds: Normal heart sounds. No murmur.  Pulmonary:     Effort: Pulmonary effort is normal. No respiratory distress.     Breath sounds: Normal breath sounds.  Abdominal:     General: Bowel sounds are normal. There is no distension.     Palpations: Abdomen is soft. There is no mass.     Tenderness: There is no abdominal tenderness.  Genitourinary:    General: Normal vulva.     Vagina: No vaginal discharge.     Rectum: Normal.  Musculoskeletal: Normal range of motion.        General: Swelling and tenderness present.  Lymphadenopathy:     Cervical: No cervical adenopathy.  Skin:    General: Skin is warm and dry.  Neurological:     Mental Status: She is alert and oriented Caley person, place, and time.  Psychiatric:        Behavior: Behavior normal.     BP 110/70 (BP Location: Left Arm, Patient Position: Sitting, Cuff Size: Normal)   Pulse 72   Temp 98.2 F (36.8 C) (Oral)   Resp 18   Wt 106 lb 9.6 oz (48.4 kg)   SpO2 99%   BMI 21.53 kg/m  Wt Readings from Last 3 Encounters:  10/16/18 106 lb 9.6 oz (48.4 kg)  10/07/18 106 lb (48.1 kg)  12/17/16 102 lb (46.3 kg)    Diabetic Foot Exam - Simple   No data filed     Lab Results  Component Value Date   WBC 5.3 10/16/2018   HGB 13.9 10/16/2018   HCT 41.5 10/16/2018   PLT 334.0 10/16/2018   GLUCOSE 83 10/16/2018   CHOL 146 10/16/2018   TRIG 129.0 10/16/2018   HDL 46.60 10/16/2018   LDLCALC 74 10/16/2018   ALT 15 10/16/2018   AST 18 10/16/2018   NA 142 10/16/2018   K 3.9 10/16/2018   CL 107 10/16/2018    CREATININE 0.57 10/16/2018   BUN 11 10/16/2018   CO2 26 10/16/2018   TSH 1.36 10/16/2018    Lab Results  Component Value Date   TSH 1.36 10/16/2018   Lab Results  Component Value Date   WBC 5.3 10/16/2018   HGB 13.9 10/16/2018   HCT 41.5 10/16/2018   MCV 91.9 10/16/2018   PLT 334.0 10/16/2018   Lab  Results  Component Value Date   NA 142 10/16/2018   K 3.9 10/16/2018   CO2 26 10/16/2018   GLUCOSE 83 10/16/2018   BUN 11 10/16/2018   CREATININE 0.57 10/16/2018   BILITOT 0.5 10/16/2018   ALKPHOS 67 10/16/2018   AST 18 10/16/2018   ALT 15 10/16/2018   PROT 7.0 10/16/2018   ALBUMIN 4.5 10/16/2018   CALCIUM 9.7 10/16/2018   GFR 112.24 10/16/2018   Lab Results  Component Value Date   CHOL 146 10/16/2018   Lab Results  Component Value Date   HDL 46.60 10/16/2018   Lab Results  Component Value Date   LDLCALC 74 10/16/2018   Lab Results  Component Value Date   TRIG 129.0 10/16/2018   Lab Results  Component Value Date   CHOLHDL 3 10/16/2018   No results found for: HGBA1C     Assessment & Plan:   Problem List Items Addressed This Visit    Preventative health care    Patient encouraged [place] maintain heart healthy diet, regular exercise, adequate sleep. Consider daily probiotics. Take medications as prescribed. Labs ordered and reviewed. Referred for colonoscopy      Relevant Orders   CBC with Differential/Platelet (Completed)   Sedimentation rate (Completed)   CK (Creatine Kinase) (Completed)   Comprehensive metabolic panel (Completed)   Lipid panel (Completed)   TSH (Completed)   Cervical cancer screening - Primary    Pap today, no concerns on exam.       Relevant Orders   Cytology - PAP( [place])   Low back pain    Proceed with imaging. Check labs. Encouraged moist heat and gentle stretching as tolerated. May try NSAIDs and prescription meds as directed and report if symptoms worsen or seek immediate care      Relevant Orders   DG Lumbar Spine  2-3 Views (Completed)   Pain in both lower legs    B/l foot pain has been seen by sports medicine and agrees [place] return Pallo them [place] discuss further care of her pain.      Cervical radiculopathy    With difficulty making a fist in the right hand in particular.        Other Visit Diagnoses    Neck pain       Relevant Orders   DG Cervical Spine Complete (Completed)   Breast cancer screening       Relevant Orders   MM 3D SCREEN BREAST BILATERAL   Arthralgia, unspecified joint       Relevant Orders   CBC with Differential/Platelet (Completed)   Sedimentation rate (Completed)   CK (Creatine Kinase) (Completed)   Comprehensive metabolic panel (Completed)   Uric acid (Completed)   Pain of both heels       Bilateral hand pain       Relevant Orders   Ambulatory referral [facility] Orthopedic Surgery   Elevated ferritin       Relevant Orders   Ferritin (Completed)      I am having Tempest Doane maintain her cetirizine.  No orders of the defined types were placed in this encounter.    Penni Homans, MD

## 2018-10-21 LAB — CYTOLOGY - PAP
Diagnosis: NEGATIVE
HPV: NOT DETECTED

## 2018-10-27 ENCOUNTER — Ambulatory Visit (HOSPITAL_BASED_OUTPATIENT_CLINIC_OR_DEPARTMENT_OTHER)
Admission: RE | Admit: 2018-10-27 | Discharge: 2018-10-27 | Disposition: A | Discharge status: Home / Self Care | Payer: BLUE CROSS/BLUE SHIELD | Source: Ambulatory Visit | Attending: Family Medicine | Admitting: Family Medicine

## 2018-10-27 ENCOUNTER — Other Ambulatory Visit: Payer: Self-pay

## 2018-10-27 ENCOUNTER — Ambulatory Visit: Payer: BLUE CROSS/BLUE SHIELD | Admitting: Family Medicine

## 2018-10-27 ENCOUNTER — Encounter (HOSPITAL_BASED_OUTPATIENT_CLINIC_OR_DEPARTMENT_OTHER): Payer: Self-pay

## 2018-10-27 DIAGNOSIS — Z1231 Encounter for screening mammogram for malignant neoplasm of breast: Secondary | ICD-10-CM | POA: Insufficient documentation

## 2018-10-27 DIAGNOSIS — Z1239 Encounter for other screening for malignant neoplasm of breast: Secondary | ICD-10-CM

## 2018-10-27 DIAGNOSIS — M5412 Radiculopathy, cervical region: Secondary | ICD-10-CM

## 2018-10-27 DIAGNOSIS — R200 Anesthesia of skin: Secondary | ICD-10-CM | POA: Diagnosis not present

## 2018-10-27 NOTE — Progress Notes (Signed)
Holly Lawrence - 48 y.o. female MRN [id number]  Date of birth: 03-18-1970  SUBJECTIVE:  Including CC & ROS.  No chief complaint on file.   Holly Lawrence is a 48 y.o. female that is following up for her hand numbness as well as the problems in the lower extremities.  She has been soaking her hands and that seems [place] have improved her symptoms at this time.  She never experiences any pain but does experience numbness in the palmar aspects with some radiation up Arlen her neck.  She also experiences more numbness in the lower extremities.  She experiences this in the Achilles as well as the plantar aspect of each foot.  The more she moves the last this is a problem.  It is most present early in the morning or when she is sitting for a period of time..  Independent review of the lumbar spine x-rays from 8/6 show no acute abnormality.  Independent review of the cervical spine x-rays from 8/8 show most degenerative changes at C5-6.   Review of Systems  Constitutional: Negative for fever.  HENT: Negative for congestion.   Respiratory: Negative for cough.   Cardiovascular: Negative for chest pain.  Gastrointestinal: Negative for abdominal pain.  Musculoskeletal: Negative for gait problem.  Skin: Negative for color change.  Neurological: Positive for numbness.    HISTORY: Past Medical, Surgical, Social, and Family History Reviewed & Updated per EMR.   Pertinent Historical Findings include:  Past Medical History:  Diagnosis Date  . Allergic rhinitis 12/17/2016  . Allergy   . Arthritis   . Cervical cancer screening 12/13/2015  . Dermatitis 08/22/2014  . Dizziness 12/17/2016  . Low back pain 12/17/2016  . Neck pain, acute 12/13/2015  . Preventative health care 08/22/2014  . Urinary frequency 12/17/2016  . Weight loss 12/17/2016    Past Surgical History:  Procedure Laterality Date  . BREAST REDUCTION SURGERY    . REDUCTION MAMMAPLASTY      Allergies  Allergen Reactions  . Fish Allergy Rash    itching     Family History  Problem Relation Age of Onset  . Arthritis Son   . COPD Maternal Grandmother      Social History   Socioeconomic History  . Marital status: Married    Spouse name: Not on file  . Number of children: Not on file  . Years of education: Not on file  . Highest education level: Not on file  Occupational History  . Occupation: Scientist, forensic  Social Needs  . Financial resource strain: Not on file  . Food insecurity    Worry: Not on file    Inability: Not on file  . Transportation needs    Medical: Not on file    Non-medical: Not on file  Tobacco Use  . Smoking status: Never Smoker  . Smokeless tobacco: Never Used  Substance and Sexual Activity  . Alcohol use: Yes    Alcohol/week: 0.0 standard drinks  . Drug use: No  . Sexual activity: Not on file    Comment: lives with husband, children. 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 Physical activity    Days per week: Not on file    Minutes per session: Not on file  . Stress: Not on file  Relationships  . Social Herbalist on phone: Not on file    Gets together: Not on file    Attends religious service: Not on file  Active member of club or organization: Not on file    Attends meetings of clubs or organizations: Not on file    Relationship status: Not on file  . Intimate partner violence    Fear of current or ex partner: Not on file    Emotionally abused: Not on file    Physically abused: Not on file    Forced sexual activity: Not on file  Other Topics Concern  . Not on file  Social History Narrative  . Not on file     PHYSICAL EXAM:  VS: There were no vitals taken for this visit. Physical Exam Gen: NAD, alert, cooperative with exam, well-appearing ENT: normal lips, normal nasal mucosa,  Eye: normal EOM, normal conjunctiva and lids CV:  no edema, +2 pedal pulses   Resp: no accessory muscle use, non-labored,   Skin: no rashes, no areas of induration   Neuro: normal tone, normal sensation [place] touch Psych:  normal insight, alert and oriented MSK:  Right and left hand:  No signs of atrophy  Normal wrist and finger ROM  Normal grip strength  Normal pincer grasp  Normal strength [place] resistance with finger adduction and abduction  Right and left lower extremity:  No signs of atrophy  Normal one leg standing  Normal strength [place] resistance  NVI      ASSESSMENT & PLAN:   Cervical radiculopathy Reports her symptoms in her hands have improved since soaking them in essential oils. No significant pain today but more altered sensation. Reported clawing of the hands but also having sensation changes in the bottom of her feet. Unclear if these are related.  - counseled on HEP and supportive care - has EMG scheduled    Numbness in feet Reports having problems that are worse after periods of rest or early in the morning.  - will determine if the EMG study is related. May need [place] consider MRI of lumbar spine.

## 2018-10-27 NOTE — Assessment & Plan Note (Signed)
Reports her symptoms in her hands have improved since soaking them in essential oils. No significant pain today but more altered sensation. Reported clawing of the hands but also having sensation changes in the bottom of her feet. Unclear if these are related.  - counseled on HEP and supportive care - has EMG scheduled

## 2018-10-27 NOTE — Patient Instructions (Signed)
Good [place] see you Please continue [place] move  Please continue soaking your hands if it helps.   Please send me a message in MyChart with any questions or updates.  Please schedule a visit or a virtual visit after the nerve study is completed.   --Dr. Raeford Razor

## 2018-10-27 NOTE — Assessment & Plan Note (Signed)
Reports having problems that are worse after periods of rest or early in the morning.  - will determine if the EMG study is related. May need [place] consider MRI of lumbar spine.

## 2018-11-06 ENCOUNTER — Encounter: Payer: BLUE CROSS/BLUE SHIELD | Admitting: Neurology

## 2018-11-27 ENCOUNTER — Ambulatory Visit: Payer: BLUE CROSS/BLUE SHIELD | Admitting: Family Medicine

## 2018-12-04 ENCOUNTER — Other Ambulatory Visit: Payer: Self-pay | Admitting: Family Medicine

## 2018-12-30 ENCOUNTER — Encounter: Payer: BLUE CROSS/BLUE SHIELD | Admitting: Neurology

## 2020-12-09 IMAGING — DX CERVICAL SPINE - COMPLETE 4+ VIEW
5 series · 5 of 5 positions shown · non-contrast
Comparison: None.

CLINICAL DATA: Neck pain with radicular symptoms

EXAM:
CERVICAL SPINE - COMPLETE 4+ VIEW

[c-spine lat]
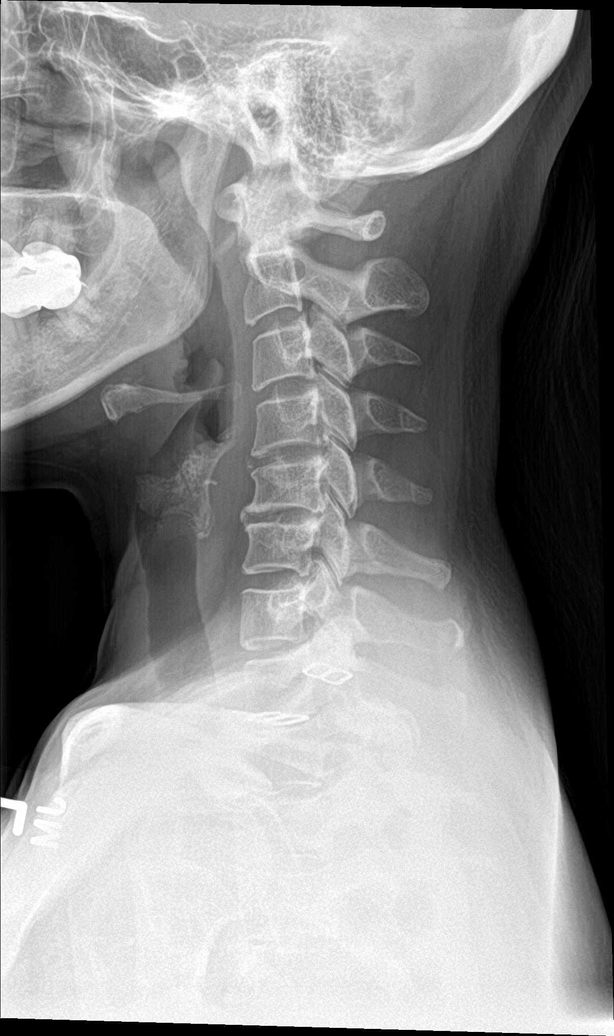

[c-spine obl (1 of 2)]
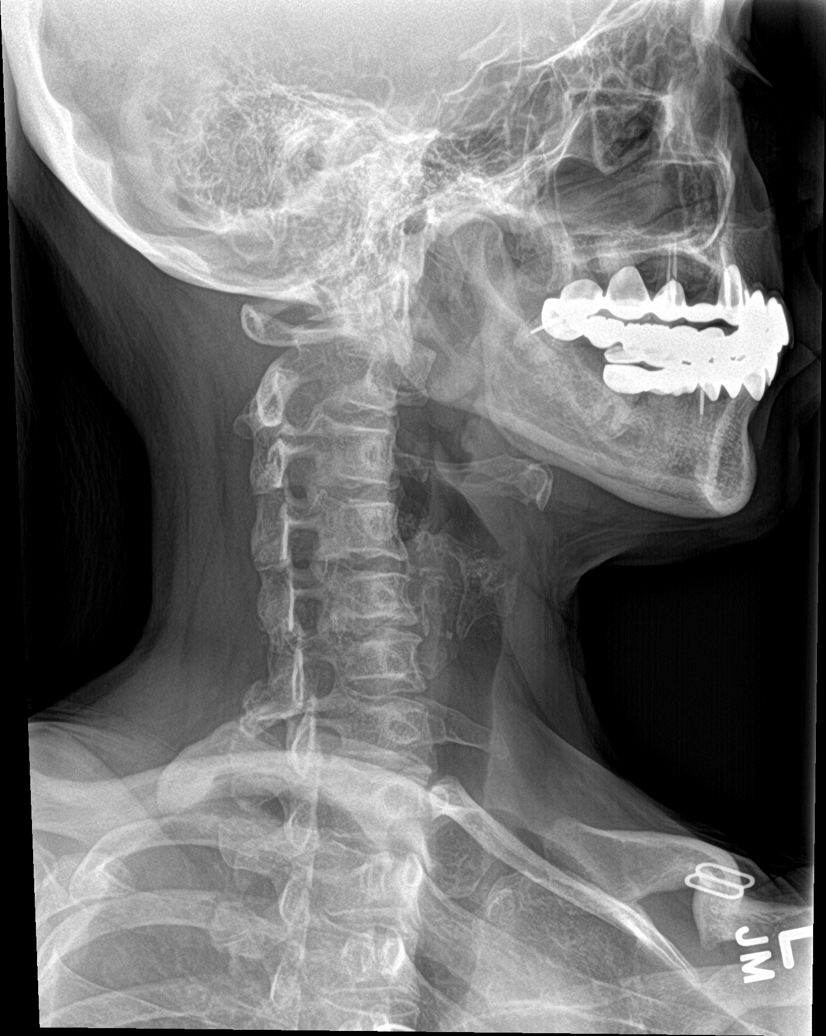

[c-spine obl (2 of 2)]
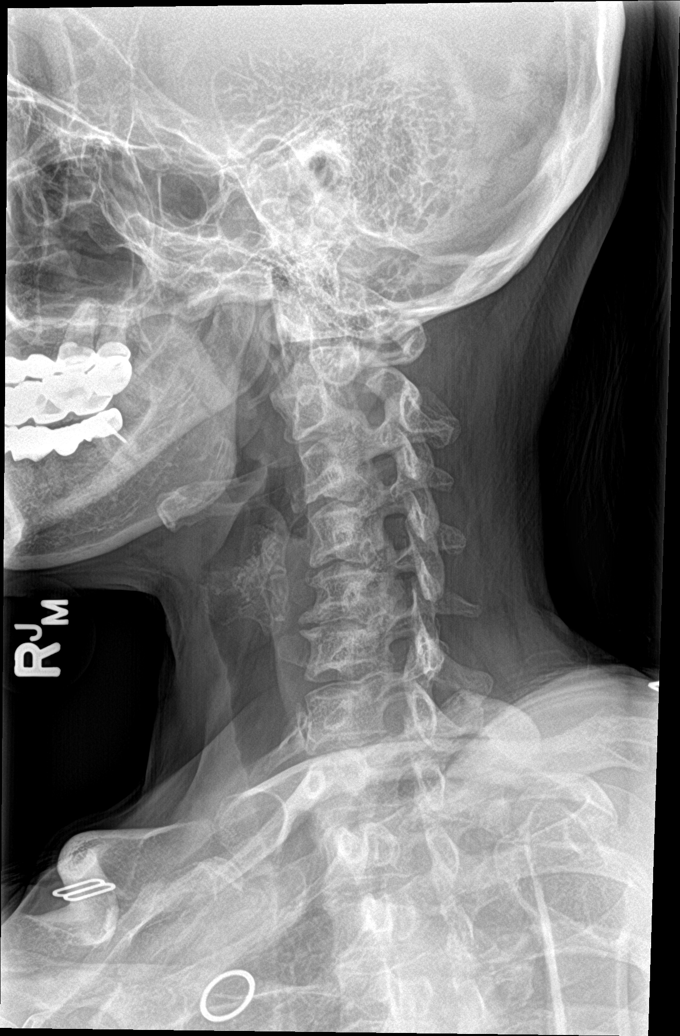

[c-spine ap]
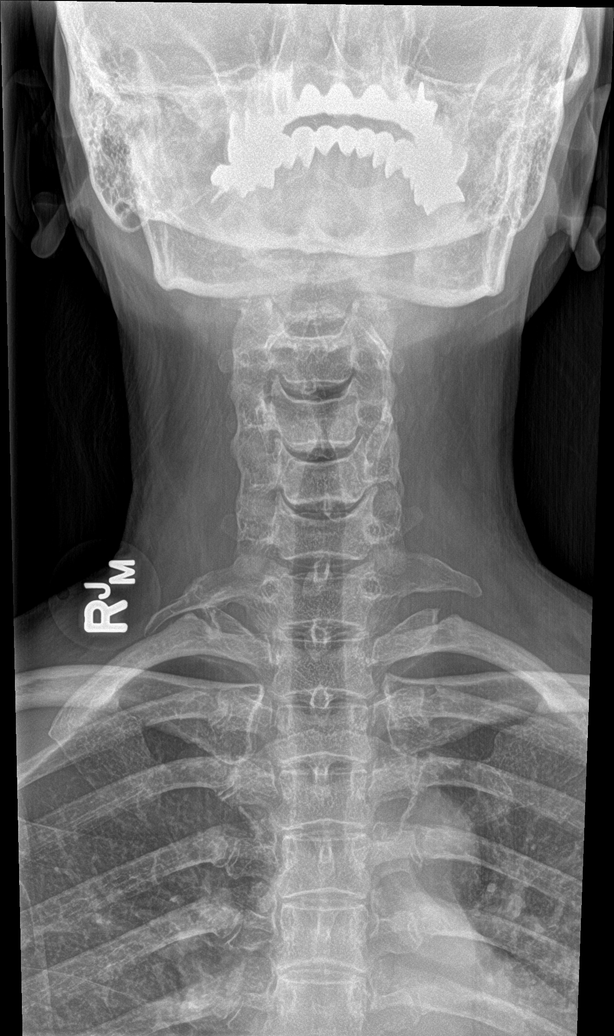

[c-spine open mouth]
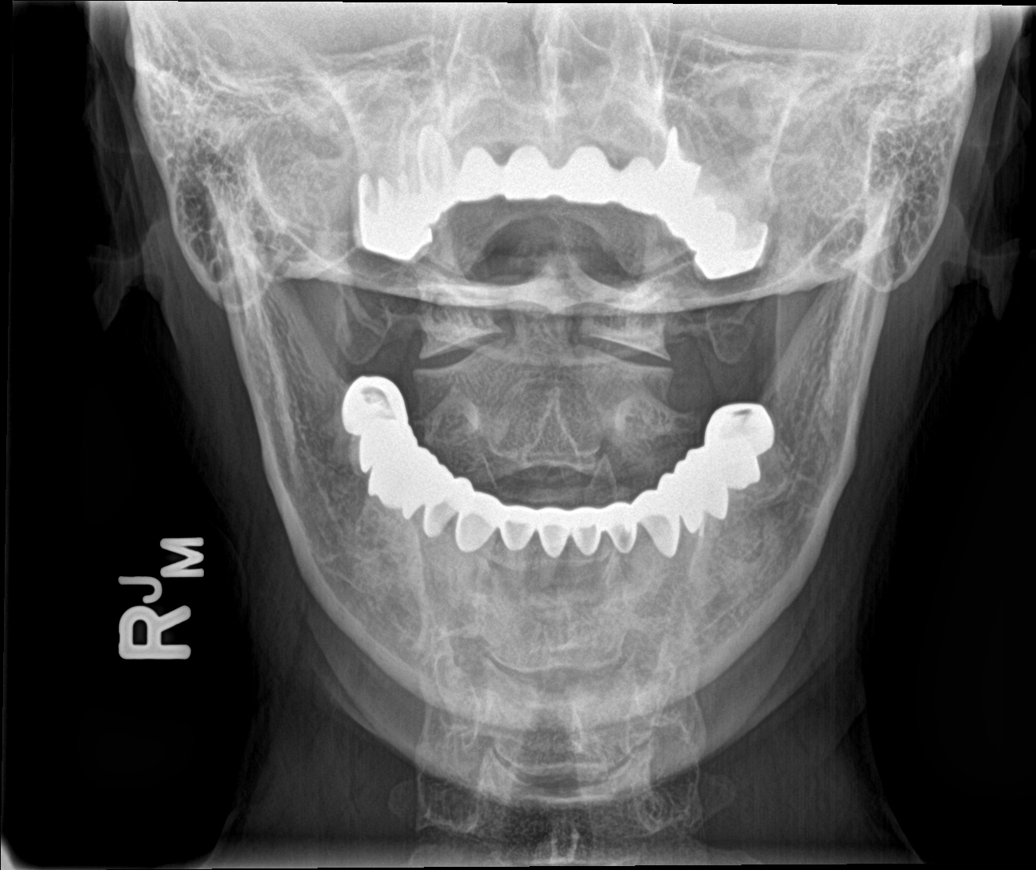

[5 of 5 positions shown; findings below may reference images not displayed]

FINDINGS: Subtle reversal of the cervical lordosis. Vertebral body heights are
maintained. No fracture evident. Mild degenerative endplate changes
at C4-5 and C5-6. Oblique views reveal mild bony neural foraminal
narrowing at C5-6 and C6-7 on the left. Elongated bilateral C7
transverse processes. Prevertebral soft tissues are within normal
limits.
IMPRESSION: Mild degenerative changes resulting in narrowing of the bony neural
foramina at C5-6 and C6-7 on the left.

## 2022-06-25 ENCOUNTER — Encounter: Payer: Self-pay | Admitting: *Deleted
